# Patient Record
Sex: Female | Born: 2000 | Race: White | Hispanic: No | Marital: Single | Smoking: Former smoker
Health system: Southern US, Community
[De-identification: ages and names within clinical notes are randomized; demographics above are authoritative.]

## PROBLEM LIST (undated history)

## (undated) DIAGNOSIS — E162 Hypoglycemia, unspecified: Secondary | ICD-10-CM

## (undated) DIAGNOSIS — K589 Irritable bowel syndrome without diarrhea: Secondary | ICD-10-CM

## (undated) DIAGNOSIS — Z8709 Personal history of other diseases of the respiratory system: Secondary | ICD-10-CM

## (undated) DIAGNOSIS — F419 Anxiety disorder, unspecified: Secondary | ICD-10-CM

## (undated) HISTORY — DX: Irritable bowel syndrome, unspecified: K58.9

## (undated) HISTORY — DX: Hypoglycemia, unspecified: E16.2

## (undated) HISTORY — DX: Anxiety disorder, unspecified: F41.9

---
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
## 2018-05-02 ENCOUNTER — Encounter: Payer: Self-pay | Admitting: Obstetrics & Gynecology

## 2018-05-02 ENCOUNTER — Ambulatory Visit (INDEPENDENT_AMBULATORY_CARE_PROVIDER_SITE_OTHER): Payer: Medicaid Other | Admitting: Obstetrics & Gynecology

## 2018-05-02 VITALS — BP 102/68 | HR 70 | Ht 64.75 in | Wt 111.0 lb

## 2018-05-02 DIAGNOSIS — Z3009 Encounter for other general counseling and advice on contraception: Secondary | ICD-10-CM

## 2018-05-02 DIAGNOSIS — Z113 Encounter for screening for infections with a predominantly sexual mode of transmission: Secondary | ICD-10-CM | POA: Diagnosis not present

## 2018-05-02 DIAGNOSIS — Z3202 Encounter for pregnancy test, result negative: Secondary | ICD-10-CM | POA: Diagnosis not present

## 2018-05-02 LAB — POCT URINE PREGNANCY: Preg Test, Ur: NEGATIVE

## 2018-05-02 MED ORDER — DESOGESTREL-ETHINYL ESTRADIOL 0.15-30 MG-MCG PO TABS: 1.0000 | ORAL_TABLET | Freq: Every day | ORAL | 12 refills | Status: DC

## 2018-05-02 NOTE — Progress Notes (Signed)
Chief Complaint  Patient presents with  . Contraception      17 y.o. G0P0000 Patient's last menstrual period was 04/25/2018 (within days). The current method of family planning is none.  Outpatient Encounter Medications as of 05/02/2018  Medication Sig  . desogestrel-ethinyl estradiol (APRI,EMOQUETTE,SOLIA) 0.15-30 MG-MCG tablet Take 1 tablet by mouth daily.   No facility-administered encounter medications on file as of 05/02/2018.     Subjective Elizabeth Larsen is here for counselling for [facility] She does not want to gain weight She has been sexually active for 4 months She has never used any BCM She also wants STI testing, no symptoms and nothing specific she knows of We discussed all the different types of BCM and she decides to get OCP History reviewed. No pertinent past medical history.  History reviewed. No pertinent surgical history.  OB History    Gravida  0   Para  0   Term  0   Preterm  0   AB  0   Living  0     SAB  0   TAB  0   Ectopic  0   Multiple  0   Live Births  0           No Known Allergies  Social History   Socioeconomic History  . Marital status: Single    Spouse name: Not on file  . Number of children: Not on file  . Years of education: Not on file  . Highest education level: Not on file  Occupational History  . Not on file  Social Needs  . Financial resource strain: Not on file  . Food insecurity:    Worry: Not on file    Inability: Not on file  . Transportation needs:    Medical: Not on file    Non-medical: Not on file  Tobacco Use  . Smoking status: Never Smoker  . Smokeless tobacco: Never Used  Substance and Sexual Activity  . Alcohol use: Never    Frequency: Never  . Drug use: Never  . Sexual activity: Yes    Birth control/protection: Condom  Lifestyle  . Physical activity:    Days per week: Not on file    Minutes per session: Not on file  . Stress: Not on file  Relationships  . Social  connections:    Talks on phone: Not on file    Gets together: Not on file    Attends religious service: Not on file    Active member of club or organization: Not on file    Attends meetings of clubs or organizations: Not on file    Relationship status: Not on file  Other Topics Concern  . Not on file  Social History Narrative  . Not on file    Family History  Problem Relation Age of Onset  . Heart attack Father     Medications:       Current Outpatient Medications:  .  desogestrel-ethinyl estradiol (APRI,EMOQUETTE,SOLIA) 0.15-30 MG-MCG tablet, Take 1 tablet by mouth daily., Disp: 1 Package, Rfl: 12  Objective Blood pressure 102/68, pulse 70, height 5' 4.75" (1.645 m), weight 111 lb (50.3 kg), last menstrual period 04/25/2018.  General WDWN female NAD Vulva:  normal appearing vulva with no masses, tenderness or lesions Vagina:  normal mucosa, no discharge Cervix:  no cervical motion tenderness, no lesions and nulliparous appearance Uterus:  normal size, contour, position, consistency, mobility, non-tender Adnexa: ovaries:present,  normal adnexa in  size, nontender and no masses  Pertinent ROS No burning with urination, frequency or urgency No nausea, vomiting or diarrhea Nor fever chills or other constitutional symptoms   Labs or studies pending    Impression Diagnoses this Encounter::   ICD-10-CM   1. Encounter for counseling regarding contraception Z30.09   2. Screening examination for STD (sexually transmitted disease) Z11.3 GC/Chlamydia Probe Amp([facility])  3. Pregnancy examination or test, negative result Z32.02 POCT urine pregnancy    Established relevant diagnosis(es):   Plan/Recommendations: Meds ordered this encounter  Medications  . desogestrel-ethinyl estradiol (APRI,EMOQUETTE,SOLIA) 0.15-30 MG-MCG tablet    Sig: Take 1 tablet by mouth daily.    Dispense:  1 Package    Refill:  12    Labs or Scans Ordered: Orders Placed This Encounter    Procedures  . GC/Chlamydia Probe Amp([facility])  . POCT urine pregnancy    Management:: Begin desogestrel OCP STI testing Encouraged to use condoms to prevent STI  Follow up Return in about 1 year (around 05/03/2019) for Follow up, with Dr Despina HiddenEure.       All questions were answered.

## 2018-05-06 LAB — GC/CHLAMYDIA PROBE AMP
CHLAMYDIA, DNA PROBE: NEGATIVE
Neisseria gonorrhoeae by PCR: NEGATIVE

## 2018-05-08 ENCOUNTER — Encounter: Payer: Self-pay | Admitting: Obstetrics & Gynecology

## 2018-05-08 DIAGNOSIS — J069 Acute upper respiratory infection, unspecified: Secondary | ICD-10-CM | POA: Diagnosis not present

## 2018-05-08 DIAGNOSIS — B001 Herpesviral vesicular dermatitis: Secondary | ICD-10-CM | POA: Diagnosis not present

## 2018-05-08 DIAGNOSIS — J029 Acute pharyngitis, unspecified: Secondary | ICD-10-CM | POA: Diagnosis not present

## 2018-05-25 DIAGNOSIS — J020 Streptococcal pharyngitis: Secondary | ICD-10-CM | POA: Diagnosis not present

## 2018-06-24 DIAGNOSIS — J029 Acute pharyngitis, unspecified: Secondary | ICD-10-CM | POA: Diagnosis not present

## 2018-06-28 DIAGNOSIS — J209 Acute bronchitis, unspecified: Secondary | ICD-10-CM | POA: Diagnosis not present

## 2018-06-28 DIAGNOSIS — J029 Acute pharyngitis, unspecified: Secondary | ICD-10-CM | POA: Diagnosis not present

## 2018-08-12 DIAGNOSIS — Z23 Encounter for immunization: Secondary | ICD-10-CM | POA: Diagnosis not present

## 2018-08-15 ENCOUNTER — Telehealth: Payer: Self-pay | Admitting: Obstetrics & Gynecology

## 2018-08-15 MED ORDER — NORGESTIMATE-ETH ESTRADIOL 0.25-35 MG-MCG PO TABS: 1.0000 | ORAL_TABLET | Freq: Every day | ORAL | 11 refills | Status: DC

## 2019-01-01 ENCOUNTER — Ambulatory Visit (INDEPENDENT_AMBULATORY_CARE_PROVIDER_SITE_OTHER): Payer: Medicaid Other | Admitting: Advanced Practice Midwife

## 2019-01-01 ENCOUNTER — Encounter: Payer: Self-pay | Admitting: Advanced Practice Midwife

## 2019-01-01 VITALS — BP 111/75 | HR 94 | Ht 66.0 in | Wt 115.0 lb

## 2019-01-01 DIAGNOSIS — Z3202 Encounter for pregnancy test, result negative: Secondary | ICD-10-CM | POA: Diagnosis not present

## 2019-01-01 DIAGNOSIS — Z30016 Encounter for initial prescription of transdermal patch hormonal contraceptive device: Secondary | ICD-10-CM

## 2019-01-01 DIAGNOSIS — Z113 Encounter for screening for infections with a predominantly sexual mode of transmission: Secondary | ICD-10-CM

## 2019-01-01 LAB — POCT URINE PREGNANCY: Preg Test, Ur: NEGATIVE

## 2019-01-01 MED ORDER — NORELGESTROMIN-ETH ESTRADIOL 150-35 MCG/24HR TD PTWK: 1.0000 | MEDICATED_PATCH | TRANSDERMAL | 12 refills | Status: DC

## 2019-01-01 NOTE — Progress Notes (Signed)
[facility] Visit  Patient name: Elizabeth Larsen MRN [id number]  Date of birth: 08/05/2001  CC & HPI:  Elizabeth Larsen is a 18 y.o.  female presenting today for birth control change. Cant remember to take pills. Also has had some bleeding after sex once.    Pertinent History Reviewed:  Medical & Surgical Hx:   History reviewed. No pertinent past medical history. History reviewed. No pertinent surgical history. Family History  Problem Relation Age of Onset  . Heart attack Father     Current Outpatient Medications:  .  Norgestimate-Eth Estradiol (SPRINTEC 28 PO), Take by mouth daily., Disp: , Rfl:  .  norelgestromin-ethinyl estradiol (ORTHO EVRA) 150-35 MCG/24HR transdermal patch, Place 1 patch onto the skin once a week., Disp: 3 patch, Rfl: 12 Social History: Reviewed -  reports that she has never smoked. She has never used smokeless tobacco.  Review of Systems:   Constitutional: Negative for fever and chills Eyes: Negative for visual disturbances Respiratory: Negative for shortness of breath, dyspnea Cardiovascular: Negative for chest pain or palpitations  Gastrointestinal: Negative for vomiting, diarrhea and constipation; no abdominal pain Genitourinary: Negative for dysuria and urgency, vaginal irritation or itching Musculoskeletal: Negative for back pain, joint pain, myalgias  Neurological: Negative for dizziness and headaches    Objective Findings:    Physical Examination: Vitals:   01/01/19 1421  BP: 111/75  Pulse: 94   General appearance - well appearing, and in no distress Mental status - alert, oriented to person, place, and time Chest:  Normal respiratory effort Heart - normal rate and regular rhythm Abdomen:  Soft, nontender Pelvic: deferred Musculoskeletal:  Normal range of motion without pain Extremities:  No edema    Results for orders placed or performed in visit on 01/01/19 (from the past 24 hour(s))  POCT urine pregnancy   Collection Time: 01/01/19  2:28 PM  Result Value Ref Range   Preg Test, Ur Negative Negative      Assessment & Plan:  A:   Birth control  BTB, ? Missed pills? P:  Change to patch, check GC/CHL   Return for If you have any problems.  Jacklyn Shell CNM 01/02/2019 12:40 PM     .

## 2019-01-01 NOTE — Patient Instructions (Signed)

## 2019-01-05 LAB — GC/CHLAMYDIA PROBE AMP
Chlamydia trachomatis, NAA: NEGATIVE
NEISSERIA GONORRHOEAE BY PCR: NEGATIVE

## 2019-01-27 DIAGNOSIS — J018 Other acute sinusitis: Secondary | ICD-10-CM | POA: Diagnosis not present

## 2019-01-27 DIAGNOSIS — R6889 Other general symptoms and signs: Secondary | ICD-10-CM | POA: Diagnosis not present

## 2019-05-06 DIAGNOSIS — H10022 Other mucopurulent conjunctivitis, left eye: Secondary | ICD-10-CM | POA: Diagnosis not present

## 2019-05-08 DIAGNOSIS — J019 Acute sinusitis, unspecified: Secondary | ICD-10-CM | POA: Diagnosis not present

## 2019-05-08 DIAGNOSIS — J029 Acute pharyngitis, unspecified: Secondary | ICD-10-CM | POA: Diagnosis not present

## 2019-05-08 DIAGNOSIS — J209 Acute bronchitis, unspecified: Secondary | ICD-10-CM | POA: Diagnosis not present

## 2019-05-08 DIAGNOSIS — J069 Acute upper respiratory infection, unspecified: Secondary | ICD-10-CM | POA: Diagnosis not present

## 2019-11-19 ENCOUNTER — Encounter: Payer: Self-pay | Admitting: Obstetrics and Gynecology

## 2019-11-19 ENCOUNTER — Other Ambulatory Visit: Payer: Self-pay

## 2019-11-19 ENCOUNTER — Ambulatory Visit (INDEPENDENT_AMBULATORY_CARE_PROVIDER_SITE_OTHER): Payer: Medicaid Other | Admitting: Obstetrics and Gynecology

## 2019-11-19 VITALS — BP 123/85 | HR 65 | Ht 66.0 in | Wt 115.4 lb

## 2019-11-19 DIAGNOSIS — N6002 Solitary cyst of left breast: Secondary | ICD-10-CM

## 2019-11-19 NOTE — Progress Notes (Signed)
Patient ID: Elizabeth Larsen, female   DOB: Sep 03, 2001, 18 y.o.   MRN: [id number]    [facility] Visit  @DATE @            Patient name: Elizabeth Larsen MRN [id number]  Date of birth: [birth year]  CC & HPI:  Elizabeth Larsen is a 18 y.o. female presenting today for a lump on her left breast that she noticed 3 weeks ago. It has been increasing in size since then. She never had any symptoms before, but after noticing it, she has been experiencing some soreness.   She performs breast self examinations regularly. She currently uses the patch and has light periods with it. The patient denies redness or discharge, fever, chills or any other symptoms or complaints at this time.   ROS:  ROS  + lump on left breast - fever - chills All systems are negative except as noted in the HPI and PMH.   Pertinent History Reviewed:   Reviewed: Medical        History reviewed. No pertinent past medical history.                            Surgical Hx:   History reviewed. No pertinent surgical history. Medications: Reviewed & Updated - see associated section                       Current Outpatient Medications:  .  norelgestromin-ethinyl estradiol (ORTHO EVRA) 150-35 MCG/24HR transdermal patch, Place 1 patch onto the skin once a week., Disp: 3 patch, Rfl: 12  Social History: Reviewed -  reports that she has never smoked. She has never used smokeless tobacco.  Objective Findings:  Vitals: Blood pressure 123/85, pulse 65, height 5\' 6"  (1.676 m), weight 115 lb 6.4 oz (52.3 kg), last menstrual period 10/20/2019.  PHYSICAL EXAMINATION General appearance - alert, well appearing, and in no distress, oriented to person, place, and time and normal appearing weight Mental status - alert, oriented to person, place, and time, normal mood, behavior, speech, dress, motor activity, and thought processes, affect appropriate to mood Breast: normal breast tissue on right side. Smooth, round, mobile cyst  on left breast.  PELVIC DEFERRED  Assessment & Plan:   A:  1.  Left breast cyst  P:  1.  Scheduled Left Breast US w/ axilla for Tuesday 11/25/2019 at 4:10pm  By signing my name below, I, De Burrs, attest that this documentation has been prepared under the direction and in the presence of Jonnie Kind, MD. Electronically Signed: De Burrs, Medical Scribe. 11/19/19. 12:03 PM.  I personally performed the services described in this documentation, which was SCRIBED in my presence. The recorded information has been reviewed and considered accurate. It has been edited as necessary during review. Jonnie Kind, MD

## 2019-11-23 DIAGNOSIS — B349 Viral infection, unspecified: Secondary | ICD-10-CM | POA: Diagnosis not present

## 2019-11-23 DIAGNOSIS — R05 Cough: Secondary | ICD-10-CM | POA: Diagnosis not present

## 2019-11-23 DIAGNOSIS — R591 Generalized enlarged lymph nodes: Secondary | ICD-10-CM | POA: Diagnosis not present

## 2019-11-23 DIAGNOSIS — J029 Acute pharyngitis, unspecified: Secondary | ICD-10-CM | POA: Diagnosis not present

## 2019-11-25 ENCOUNTER — Other Ambulatory Visit: Payer: Self-pay

## 2019-11-25 ENCOUNTER — Other Ambulatory Visit (HOSPITAL_COMMUNITY): Payer: Medicaid Other

## 2019-11-25 ENCOUNTER — Ambulatory Visit (HOSPITAL_COMMUNITY)
Admission: RE | Admit: 2019-11-25 | Discharge: 2019-11-25 | Disposition: A | Discharge status: Home / Self Care | Payer: Medicaid Other | Source: Ambulatory Visit | Attending: Obstetrics and Gynecology | Admitting: Obstetrics and Gynecology

## 2019-11-25 DIAGNOSIS — N6325 Unspecified lump in the left breast, overlapping quadrants: Secondary | ICD-10-CM | POA: Diagnosis not present

## 2019-11-25 DIAGNOSIS — N6002 Solitary cyst of left breast: Secondary | ICD-10-CM | POA: Insufficient documentation

## 2020-01-01 DIAGNOSIS — J029 Acute pharyngitis, unspecified: Secondary | ICD-10-CM | POA: Diagnosis not present

## 2020-01-14 ENCOUNTER — Other Ambulatory Visit: Payer: Self-pay

## 2020-01-14 ENCOUNTER — Encounter: Payer: Self-pay | Admitting: Obstetrics and Gynecology

## 2020-01-14 ENCOUNTER — Ambulatory Visit (INDEPENDENT_AMBULATORY_CARE_PROVIDER_SITE_OTHER): Payer: Medicaid Other | Admitting: Obstetrics and Gynecology

## 2020-01-14 VITALS — BP 113/79 | HR 65 | Ht 66.0 in | Wt 115.0 lb

## 2020-01-14 DIAGNOSIS — Z3009 Encounter for other general counseling and advice on contraception: Secondary | ICD-10-CM | POA: Diagnosis not present

## 2020-01-14 DIAGNOSIS — Z3202 Encounter for pregnancy test, result negative: Secondary | ICD-10-CM | POA: Diagnosis not present

## 2020-01-14 LAB — POCT URINE PREGNANCY: Preg Test, Ur: NEGATIVE

## 2020-01-14 MED ORDER — NORETHIN ACE-ETH ESTRAD-FE 1-20 MG-MCG(24) PO TABS: 1.0000 | ORAL_TABLET | Freq: Every day | ORAL | 3 refills | Status: DC

## 2020-01-14 NOTE — Progress Notes (Signed)
Patient ID: ALMAS RAKE, female   DOB: [birth year], 19 y.o.   MRN: [id number]    [facility] Clinic Visit  @DATE @            Patient name: Elizabeth Larsen MRN Elizabeth Larsen  Date of birth: June 30, 2001  CC & HPI:  Elizabeth Larsen is a 19 y.o. female presenting today to discuss birth control options. She currently has the patch but is more interested in switching to the pill.  Of note, since her last visit on 11/19/2019 regarding a breast cyst, she had a left breast ultrasound on 11/25/2019. 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  ROS:  ROS    All systems are negative except as noted in the HPI and PMH.   Pertinent History Reviewed:   Reviewed Medical        History reviewed. No pertinent past medical history.                            Surgical Hx:   History reviewed. No pertinent surgical history. Medications: Reviewed & Updated - see associated section                       Current Outpatient Medications:  .  amoxicillin (AMOXIL) 875 MG tablet, Take by mouth., Disp: , Rfl:  .  fluconazole (DIFLUCAN) 150 MG tablet, Take by mouth., Disp: , Rfl:  .  norelgestromin-ethinyl estradiol (ORTHO EVRA) 150-35 MCG/24HR transdermal patch, Place 1 patch onto the skin once a week., Disp: 3 patch, Rfl: 12   Social History: Reviewed -  reports that she has been smoking e-cigarettes. She has never used smokeless tobacco.  Objective Findings:  Vitals: Blood pressure 113/79, pulse 65, height 5\' 6"  (1.676 m), weight 115 lb (52.2 kg), last menstrual period 12/17/2019.  PHYSICAL EXAMINATION General appearance - alert, well appearing, and in no distress and oriented to person, place, and time Mental status - normal mood, behavior, speech, dress, motor activity, and thought processes, affect appropriate to mood Chest - not examined Heart - normal rate and regular rhythm Abdomen -  Breasts -  Skin -   PELVIC    Assessment & Plan:   A:  1.  Birth  control  P:  1.  switch to loloestrin 1/20 3 pack    By signing my name below, I, 12/19/2019, attest that this documentation has been prepared under the direction and in the presence of 2/20, MD. Electronically Signed: Maleeha Nikki Larsen. 01/14/20. 8:53 AM.  I personally performed the services described in this documentation, which was SCRIBED in my presence. The recorded information has been reviewed and considered accurate. It has been edited as necessary during review. [employer], MD

## 2020-01-16 DIAGNOSIS — J3503 Chronic tonsillitis and adenoiditis: Secondary | ICD-10-CM | POA: Diagnosis not present

## 2020-01-16 DIAGNOSIS — J039 Acute tonsillitis, unspecified: Secondary | ICD-10-CM | POA: Diagnosis not present

## 2020-01-23 ENCOUNTER — Other Ambulatory Visit: Payer: Self-pay | Admitting: Otolaryngology

## 2020-01-23 DIAGNOSIS — J3503 Chronic tonsillitis and adenoiditis: Secondary | ICD-10-CM | POA: Diagnosis not present

## 2020-01-23 DIAGNOSIS — J353 Hypertrophy of tonsils with hypertrophy of adenoids: Secondary | ICD-10-CM | POA: Diagnosis not present

## 2020-01-26 ENCOUNTER — Encounter (HOSPITAL_BASED_OUTPATIENT_CLINIC_OR_DEPARTMENT_OTHER): Payer: Self-pay | Admitting: Otolaryngology

## 2020-01-26 ENCOUNTER — Other Ambulatory Visit: Payer: Self-pay

## 2020-01-29 ENCOUNTER — Other Ambulatory Visit (HOSPITAL_COMMUNITY)
Admission: RE | Admit: 2020-01-29 | Discharge: 2020-01-29 | Disposition: A | Discharge status: Home / Self Care | Payer: Medicaid Other | Source: Ambulatory Visit | Attending: Otolaryngology | Admitting: Otolaryngology

## 2020-01-29 ENCOUNTER — Other Ambulatory Visit: Payer: Self-pay

## 2020-01-29 DIAGNOSIS — Z20822 Contact with and (suspected) exposure to covid-19: Secondary | ICD-10-CM | POA: Insufficient documentation

## 2020-01-30 LAB — SARS CORONAVIRUS 2 (TAT 6-24 HRS): SARS Coronavirus 2: NEGATIVE

## 2020-02-02 ENCOUNTER — Ambulatory Visit (HOSPITAL_BASED_OUTPATIENT_CLINIC_OR_DEPARTMENT_OTHER)
Admission: RE | Admit: 2020-02-02 | Discharge: 2020-02-02 | Disposition: A | Discharge status: Home / Self Care | Payer: Medicaid Other | Attending: Otolaryngology | Admitting: Otolaryngology

## 2020-02-02 ENCOUNTER — Encounter (HOSPITAL_BASED_OUTPATIENT_CLINIC_OR_DEPARTMENT_OTHER): Payer: Self-pay | Admitting: Otolaryngology

## 2020-02-02 ENCOUNTER — Ambulatory Visit (HOSPITAL_BASED_OUTPATIENT_CLINIC_OR_DEPARTMENT_OTHER): Payer: Medicaid Other | Admitting: Certified Registered Nurse Anesthetist

## 2020-02-02 ENCOUNTER — Encounter (HOSPITAL_BASED_OUTPATIENT_CLINIC_OR_DEPARTMENT_OTHER)
Admission: RE | Disposition: A | Discharge status: Home / Self Care | Payer: Self-pay | Source: Home / Self Care | Attending: Otolaryngology

## 2020-02-02 ENCOUNTER — Other Ambulatory Visit: Payer: Self-pay

## 2020-02-02 DIAGNOSIS — R1310 Dysphagia, unspecified: Secondary | ICD-10-CM | POA: Insufficient documentation

## 2020-02-02 DIAGNOSIS — J353 Hypertrophy of tonsils with hypertrophy of adenoids: Secondary | ICD-10-CM | POA: Diagnosis not present

## 2020-02-02 DIAGNOSIS — J3501 Chronic tonsillitis: Secondary | ICD-10-CM | POA: Insufficient documentation

## 2020-02-02 DIAGNOSIS — J358 Other chronic diseases of tonsils and adenoids: Secondary | ICD-10-CM | POA: Insufficient documentation

## 2020-02-02 DIAGNOSIS — J3503 Chronic tonsillitis and adenoiditis: Secondary | ICD-10-CM | POA: Diagnosis not present

## 2020-02-02 DIAGNOSIS — F17200 Nicotine dependence, unspecified, uncomplicated: Secondary | ICD-10-CM | POA: Diagnosis not present

## 2020-02-02 HISTORY — PX: TONSILLECTOMY AND ADENOIDECTOMY: SHX28

## 2020-02-02 HISTORY — DX: Personal history of other diseases of the respiratory system: Z87.09

## 2020-02-02 LAB — POCT PREGNANCY, URINE: Preg Test, Ur: NEGATIVE

## 2020-02-02 SURGERY — TONSILLECTOMY AND ADENOIDECTOMY
Anesthesia: General | Site: Mouth

## 2020-02-02 MED ORDER — FENTANYL CITRATE (PF) 100 MCG/2ML IJ SOLN
INTRAMUSCULAR | Status: AC
  Filled 2020-02-02: qty 2

## 2020-02-02 MED ORDER — FENTANYL CITRATE (PF) 100 MCG/2ML IJ SOLN
25.0000 ug | INTRAMUSCULAR | Status: DC | PRN
  Administered 2020-02-02: 10:00:00 25 ug via INTRAVENOUS

## 2020-02-02 MED ORDER — ACETAMINOPHEN 10 MG/ML IV SOLN
INTRAVENOUS | Status: AC
  Filled 2020-02-02: qty 100

## 2020-02-02 MED ORDER — ACETAMINOPHEN 160 MG/5ML PO SOLN: 325.0000 mg | Freq: Once | ORAL | Status: DC | PRN

## 2020-02-02 MED ORDER — MIDAZOLAM HCL 2 MG/2ML IJ SOLN
INTRAMUSCULAR | Status: DC | PRN
  Administered 2020-02-02: 2 mg via INTRAVENOUS

## 2020-02-02 MED ORDER — PROMETHAZINE HCL 25 MG/ML IJ SOLN: 6.2500 mg | INTRAMUSCULAR | Status: DC | PRN

## 2020-02-02 MED ORDER — DEXAMETHASONE SODIUM PHOSPHATE 10 MG/ML IJ SOLN
INTRAMUSCULAR | Status: AC
  Filled 2020-02-02: qty 1

## 2020-02-02 MED ORDER — MIDAZOLAM HCL 2 MG/2ML IJ SOLN
INTRAMUSCULAR | Status: AC
  Filled 2020-02-02: qty 2

## 2020-02-02 MED ORDER — LACTATED RINGERS IV SOLN: INTRAVENOUS | Status: DC

## 2020-02-02 MED ORDER — OXYCODONE HCL 5 MG PO TABS: 5.0000 mg | ORAL_TABLET | Freq: Once | ORAL | Status: DC | PRN

## 2020-02-02 MED ORDER — ACETAMINOPHEN 325 MG PO TABS: 325.0000 mg | ORAL_TABLET | Freq: Once | ORAL | Status: DC | PRN

## 2020-02-02 MED ORDER — OXYMETAZOLINE HCL 0.05 % NA SOLN
NASAL | Status: DC | PRN
  Administered 2020-02-02: 1 via TOPICAL

## 2020-02-02 MED ORDER — OXYCODONE-ACETAMINOPHEN 5-325 MG PO TABS: 1.0000 | ORAL_TABLET | ORAL | 0 refills | Status: AC | PRN

## 2020-02-02 MED ORDER — DEXAMETHASONE SODIUM PHOSPHATE 10 MG/ML IJ SOLN
INTRAMUSCULAR | Status: DC | PRN
  Administered 2020-02-02: 10 mg via INTRAVENOUS

## 2020-02-02 MED ORDER — OXYCODONE HCL 5 MG/5ML PO SOLN: 5.0000 mg | Freq: Once | ORAL | Status: DC | PRN

## 2020-02-02 MED ORDER — PROPOFOL 10 MG/ML IV BOLUS
INTRAVENOUS | Status: DC | PRN
  Administered 2020-02-02: 130 mg via INTRAVENOUS

## 2020-02-02 MED ORDER — LIDOCAINE 2% (20 MG/ML) 5 ML SYRINGE
INTRAMUSCULAR | Status: AC
  Filled 2020-02-02: qty 5

## 2020-02-02 MED ORDER — SUCCINYLCHOLINE CHLORIDE 200 MG/10ML IV SOSY
PREFILLED_SYRINGE | INTRAVENOUS | Status: DC | PRN
  Administered 2020-02-02: 50 mg via INTRAVENOUS

## 2020-02-02 MED ORDER — SUCCINYLCHOLINE CHLORIDE 200 MG/10ML IV SOSY
PREFILLED_SYRINGE | INTRAVENOUS | Status: AC
  Filled 2020-02-02: qty 10

## 2020-02-02 MED ORDER — LIDOCAINE 2% (20 MG/ML) 5 ML SYRINGE
INTRAMUSCULAR | Status: DC | PRN
  Administered 2020-02-02: 60 mg via INTRAVENOUS

## 2020-02-02 MED ORDER — AMOXICILLIN 400 MG/5ML PO SUSR: 800.0000 mg | Freq: Two times a day (BID) | ORAL | 0 refills | Status: AC

## 2020-02-02 MED ORDER — ACETAMINOPHEN 10 MG/ML IV SOLN
1000.0000 mg | Freq: Once | INTRAVENOUS | Status: DC | PRN
  Administered 2020-02-02: 1000 mg via INTRAVENOUS

## 2020-02-02 MED ORDER — FENTANYL CITRATE (PF) 100 MCG/2ML IJ SOLN
INTRAMUSCULAR | Status: DC | PRN
  Administered 2020-02-02: 100 ug via INTRAVENOUS

## 2020-02-02 MED ORDER — ONDANSETRON HCL 4 MG/2ML IJ SOLN
INTRAMUSCULAR | Status: DC | PRN
  Administered 2020-02-02: 4 mg via INTRAVENOUS

## 2020-02-02 MED ORDER — MEPERIDINE HCL 25 MG/ML IJ SOLN: 6.2500 mg | INTRAMUSCULAR | Status: DC | PRN

## 2020-02-02 MED ORDER — ONDANSETRON HCL 4 MG/2ML IJ SOLN
INTRAMUSCULAR | Status: AC
  Filled 2020-02-02: qty 2

## 2020-02-02 SURGICAL SUPPLY — 33 items
BNDG COHESIVE 2X5 TAN STRL LF (GAUZE/BANDAGES/DRESSINGS) IMPLANT
CANISTER SUCT 1200ML W/VALVE (MISCELLANEOUS) ×3 IMPLANT
CATH ROBINSON RED A/P 10FR (CATHETERS) ×3 IMPLANT
CATH ROBINSON RED A/P 14FR (CATHETERS) IMPLANT
COAGULATOR SUCT 6 FR SWTCH (ELECTROSURGICAL) ×1
COAGULATOR SUCT SWTCH 10FR 6 (ELECTROSURGICAL) ×2 IMPLANT
COVER BACK TABLE 60X90IN (DRAPES) ×3 IMPLANT
COVER MAYO STAND STRL (DRAPES) ×3 IMPLANT
COVER WAND RF STERILE (DRAPES) IMPLANT
ELECT REM PT RETURN 9FT ADLT (ELECTROSURGICAL) ×3
ELECT REM PT RETURN 9FT PED (ELECTROSURGICAL)
ELECTRODE REM PT RETRN 9FT PED (ELECTROSURGICAL) IMPLANT
ELECTRODE REM PT RTRN 9FT ADLT (ELECTROSURGICAL) ×1 IMPLANT
GAUZE SPONGE 4X4 12PLY STRL LF (GAUZE/BANDAGES/DRESSINGS) ×3 IMPLANT
GLOVE BIO SURGEON STRL SZ 6.5 (GLOVE) ×2 IMPLANT
GLOVE BIO SURGEON STRL SZ7.5 (GLOVE) ×3 IMPLANT
GLOVE BIO SURGEONS STRL SZ 6.5 (GLOVE) ×1
GOWN STRL REUS W/ TWL LRG LVL3 (GOWN DISPOSABLE) ×2 IMPLANT
GOWN STRL REUS W/TWL LRG LVL3 (GOWN DISPOSABLE) ×6
IV NS 500ML (IV SOLUTION) ×3
IV NS 500ML BAXH (IV SOLUTION) ×1 IMPLANT
MARKER SKIN DUAL TIP RULER LAB (MISCELLANEOUS) IMPLANT
NS IRRIG 1000ML POUR BTL (IV SOLUTION) ×3 IMPLANT
SHEET MEDIUM DRAPE 40X70 STRL (DRAPES) ×3 IMPLANT
SOLUTION BUTLER CLEAR DIP (MISCELLANEOUS) ×3 IMPLANT
SPONGE TONSIL TAPE 1.25 RFD (DISPOSABLE) ×3 IMPLANT
SYR BULB 3OZ (MISCELLANEOUS) IMPLANT
TOWEL GREEN STERILE FF (TOWEL DISPOSABLE) ×3 IMPLANT
TUBE CONNECTING 20'X1/4 (TUBING) ×1
TUBE CONNECTING 20X1/4 (TUBING) ×2 IMPLANT
TUBE SALEM SUMP 12R W/ARV (TUBING) IMPLANT
TUBE SALEM SUMP 16 FR W/ARV (TUBING) ×3 IMPLANT
WAND COBLATOR 70 EVAC XTRA (SURGICAL WAND) ×3 IMPLANT

## 2020-02-02 NOTE — Discharge Instructions (Signed)
[facility] Postoperative Instructions for Tonsillectomy & Adenoidectomy (T&A) Activity Restrict activity at home for the first two days, resting as much as possible. Light indoor activity is best. You may usually return to school or work within a week but void strenuous activity and sports for two weeks. Sleep with your head elevated on 2-3 pillows for 3-4 days to help decrease swelling. Diet Due to tissue swelling and throat discomfort, you may have little desire to drink for several days. However fluids are very important to prevent dehydration. You will find that non-acidic juices, soups, popsicles, Jell-O, custard, puddings, and any soft or mashed foods taken in small quantities can be swallowed fairly easily. Try to increase your fluid and food intake as the discomfort subsides. It is recommended that a child receive 1-1/2 quarts of fluid in a 24-hour period. Adult require twice this amount.  Discomfort Your sore throat may be relieved by applying an ice collar to your neck and/or by taking Tylenol. You may experience an earache, which is due to referred pain from the throat. Referred ear pain is commonly felt at night when trying to rest.  Bleeding                        Although rare, there is risk of having some bleeding during the first 2 weeks after having a T&A. This usually happens between days 7-10 postoperatively. If you or your child should have any bleeding, try to remain calm. We recommend sitting up quietly in a chair and gently spitting out the blood into a bowl. For adults, gargling gently with ice water may help. If the bleeding does not stop after a short time (5 minutes), is more than 1 teaspoonful, or if you become worried, please call our office at [phone] or go directly to the nearest hospital emergency room. Do not eat or drink anything prior to going to the hospital as you may need to be taken to the operating room in order to control the bleeding. GENERAL  CONSIDERATIONS 1. Brush your teeth regularly. Avoid mouthwashes and gargles for three weeks. You may gargle gently with warm salt-water as necessary or spray with Chloraseptic. You may make salt-water by placing 2 teaspoons of table salt into a quart of fresh water. Warm the salt-water in a microwave to a luke warm temperature.  2. Avoid exposure to colds and upper respiratory infections if possible.  3. If you look into a mirror or into your child's mouth, you will see white-gray patches in the back of the throat. This is normal after having a T&A and is like a scab that forms on the skin after an abrasion. It will disappear once the back of the throat heals completely. However, it may cause a noticeable odor; this too will disappear with time. Again, warm salt-water gargles may be used to help keep the throat clean and promote healing.  4. You may notice a temporary change in voice quality, such as a higher pitched voice or a nasal sound, until healing is complete. This may last for 1-2 weeks and should resolve.  5. Do not take or give you child any medications that we have not prescribed or recommended.  6. Snoring may occur, especially at night, for the first week after a T&A. It is due to swelling of the soft palate and will usually resolve.  Please call our office at [phone] if you have any questions.       Post Anesthesia Home Care Instructions  Activity: Get plenty of rest for the remainder of the day. A responsible individual must stay with you for 24 hours following the procedure.  For the next 24 hours, DO NOT: -Drive a car -Advertising copywriter -Drink alcoholic beverages -Take any medication unless instructed by your physician -Make any legal decisions or sign important papers.  Meals: Start with liquid foods such as gelatin or soup. Progress to regular foods as tolerated. Avoid greasy, spicy, heavy foods. If nausea and/or vomiting occur, drink only clear liquids until the nausea  and/or vomiting subsides. Call your physician if vomiting continues.  Special Instructions/Symptoms: Your throat may feel dry or sore from the anesthesia or the breathing tube placed in your throat during surgery. If this causes discomfort, gargle with warm salt water. The discomfort should disappear within 24 hours.  Call your surgeon if you experience:   1.  Fever over 101.0. 2.  Inability to urinate. 3.  Nausea and/or vomiting. 4.  Extreme swelling or bruising at the surgical site. 5.  Continued bleeding from the incision. 6.  Increased pain, redness or drainage from the incision. 7.  Problems related to your pain medication. 8.  Any problems and/or concerns  May have Tylenol after 3:30pm

## 2020-02-02 NOTE — Anesthesia Postprocedure Evaluation (Signed)
Anesthesia Post Note  Patient: Elizabeth Larsen  Procedure(s) Performed: TONSILLECTOMY AND ADENOIDECTOMY (N/A Mouth)     Patient location during evaluation: PACU Anesthesia Type: General Level of consciousness: awake and alert Pain management: pain level controlled Vital Signs Assessment: post-procedure vital signs reviewed and stable Respiratory status: spontaneous breathing, nonlabored ventilation, respiratory function stable and patient connected to nasal cannula oxygen Cardiovascular status: blood pressure returned to baseline and stable Postop Assessment: no apparent nausea or vomiting Anesthetic complications: no    Last Vitals:  Vitals:   02/02/20 1000 02/02/20 1019  BP:  (!) 140/93  Pulse: 61 72  Resp: (!) 23 18  Temp:  36.8 C  SpO2: 100% 100%    Last Pain:  Vitals:   02/02/20 1019  TempSrc: Oral  PainSc: 6                  Shelton Silvas

## 2020-02-02 NOTE — Anesthesia Preprocedure Evaluation (Addendum)
Anesthesia Evaluation  Patient identified by MRN, date of birth, ID band Patient awake    Reviewed: Allergy & Precautions, NPO status , Patient's Chart, lab work & pertinent test results  Airway Mallampati: I  TM Distance: >3 FB Neck ROM: Full    Dental  (+) Teeth Intact, Dental Advisory Given   Pulmonary Current Smoker and Patient abstained from smoking.,    breath sounds clear to auscultation       Cardiovascular negative cardio ROS   Rhythm:Regular Rate:Normal     Neuro/Psych negative neurological ROS  negative psych ROS   GI/Hepatic negative GI ROS, Neg liver ROS,   Endo/Other  negative endocrine ROS  Renal/GU negative Renal ROS     Musculoskeletal negative musculoskeletal ROS (+)   Abdominal Normal abdominal exam  (+)   Peds  Hematology negative hematology ROS (+)   Anesthesia Other Findings   Reproductive/Obstetrics                            Anesthesia Physical Anesthesia Plan  ASA: II  Anesthesia Plan: General   Post-op Pain Management:    Induction: Intravenous  PONV Risk Score and Plan: 3 and Ondansetron, Dexamethasone and Midazolam  Airway Management Planned: Oral ETT  Additional Equipment: None  Intra-op Plan:   Post-operative Plan: Extubation in OR  Informed Consent: I have reviewed the patients History and Physical, chart, labs and discussed the procedure including the risks, benefits and alternatives for the proposed anesthesia with the patient or authorized representative who has indicated his/her understanding and acceptance.     Dental advisory given  Plan Discussed with: CRNA  Anesthesia Plan Comments:        Anesthesia Quick Evaluation

## 2020-02-02 NOTE — H&P (Signed)
Cc: Recurrent tonsillitis and sore throat  HPI: The patient is a 19 year old female who presents today with her mother.  The patient complains of recurrent tonsillitis and sore throat for the past year.  She has had more than 10 episodes of tonsillitis over the past 12 months.  She was treated with multiple courses of antibiotics.  As a result, she has been experiencing frequent dysphagia and odynophagia.  She has also noted frequent tonsil stone formations.  She has no previous history of ENT surgery.  She has been vaping for the past year.   The patient's review of systems (constitutional, eyes, ENT, cardiovascular, respiratory, GI, musculoskeletal, skin, neurologic, psychiatric, endocrine, hematologic, allergic) is noted in the ROS questionnaire.  It is reviewed with the patient.  Family health history: No HTN, DM, CAD, hearing loss or bleeding disorder.  Major events: None.  Ongoing medical problems: None.  Social history: The patient is single. She vapes daily. She denies the use of alcohol or illegal drugs.    Exam: General: Communicates without difficulty, well nourished, no acute distress. Head: Normocephalic, no evidence injury, no tenderness, facial buttresses intact without stepoff. Face/sinus: No tenderness to palpation and percussion. Facial movement is normal and symmetric. Eyes: PERRL, EOMI. No scleral icterus, conjunctivae clear. Neuro: CN II exam reveals vision grossly intact.  No nystagmus at any point of gaze. Ears: Auricles well formed without lesions.  Ear canals are intact without mass or lesion.  No erythema or edema is appreciated.  The TMs are intact without fluid. Nose: External evaluation reveals normal support and skin without lesions.  Dorsum is intact.  Anterior rhinoscopy reveals pink mucosa over anterior aspect of inferior turbinates and intact septum.  No purulence noted. Oral:  Oral cavity and oropharynx are intact, symmetric, without erythema or edema.  Mucosa is moist  without lesions. Tonsils are 3+ and mildly erythematous. Neck: Full range of motion without pain.  There is no significant lymphadenopathy.  No masses palpable.  Thyroid bed within normal limits to palpation.  Parotid glands and submandibular glands equal bilaterally without mass.  Trachea is midline. Neuro:  CN 2-12 grossly intact. Gait normal.   Assessment 1.  The patient's history and physical exam findings are consistent with chronic tonsillitis and pharyngitis, secondary to adenotonsillar hypertrophy.   2.  The rest of her ENT exam is normal.    Plan  1.  The physical exam findings are reviewed with the patient and her mother.  2.  Based on the above findings, the patient may benefit from undergoing the adenotonsillectomy procedure.  The risks, benefits, alternatives and details of the procedure are reviewed.  Questions are invited and answered.  3.  The patient would like to proceed with the procedure.

## 2020-02-02 NOTE — Op Note (Signed)
DATE OF PROCEDURE:  02/02/2020                              OPERATIVE REPORT  SURGEON:  Newman Pies, MD  PREOPERATIVE DIAGNOSES: 1. Adenotonsillar hypertrophy. 2. Chronic tonsillitis and pharyngitis  POSTOPERATIVE DIAGNOSES: 1. Adenotonsillar hypertrophy. 2. Chronic tonsillitis and pharyngitis  PROCEDURE PERFORMED:  Adenotonsillectomy.  ANESTHESIA:  General endotracheal tube anesthesia.  COMPLICATIONS:  None.  ESTIMATED BLOOD LOSS:  Minimal.  INDICATION FOR PROCEDURE:  Elizabeth Larsen is a 19 y.o. female with a history of chronic tonsillitis/pharyngitis and halitosis.  According to the patient, she has been experiencing chronic throat discomfort with halitosis for several years. The patient continued to be symptomatic despite medical treatments. On examination, the patient was noted to have bilateral cryptic tonsils, with numerous tonsilloliths. Based on the above findings, the decision was made for the patient to undergo the adenotonsillectomy procedure. Likelihood of success in reducing symptoms was also discussed.  The risks, benefits, alternatives, and details of the procedure were discussed with the patient.  Questions were invited and answered.  Informed consent was obtained.  DESCRIPTION:  The patient was taken to the operating room and placed supine on the operating table.  General endotracheal tube anesthesia was administered by the anesthesiologist.  The patient was positioned and prepped and draped in a standard fashion for adenotonsillectomy.  A Crowe-Davis mouth gag was inserted into the oral cavity for exposure. 2+ cryptic tonsils were noted bilaterally.  No bifidity was noted.  Indirect mirror examination of the nasopharynx revealed mild adenoid hypertrophy. The adenoid was ablated with the Coblator device. Hemostasis was achieved with the Coblator device.  The right tonsil was then grasped with a straight Allis clamp and retracted medially.  It was resected free from the  underlying pharyngeal constrictor muscles with the Coblator device.  The same procedure was repeated on the left side without exception.  The surgical sites were copiously irrigated.  The mouth gag was removed.  The care of the patient was turned over to the anesthesiologist.  The patient was awakened from anesthesia without difficulty.  The patient was extubated and transferred to the recovery room in good condition.  OPERATIVE FINDINGS:  Adenotonsillar hypertrophy.  SPECIMEN:  None.  FOLLOWUP CARE:  The patient will be discharged home once awake and alert.  She will be placed on amoxicillin 800 mg p.o. b.i.d. for 5 days, and percocet for postop pain control.   The patient will follow up in my office in approximately 2 weeks.  Donley Harland W Tammela Bales 02/02/2020 9:13 AM

## 2020-02-02 NOTE — Anesthesia Procedure Notes (Signed)
Procedure Name: Intubation Date/Time: 02/02/2020 8:44 AM Performed by: Pearson Grippe, CRNA Pre-anesthesia Checklist: Patient identified, Emergency Drugs available, Suction available and Patient being monitored Patient Re-evaluated:Patient Re-evaluated prior to induction Oxygen Delivery Method: Circle system utilized Preoxygenation: Pre-oxygenation with 100% oxygen Induction Type: IV induction Ventilation: Mask ventilation without difficulty Laryngoscope Size: Miller and 2 Grade View: Grade I Tube type: Oral Tube size: 7.0 mm Number of attempts: 1 Airway Equipment and Method: Stylet and Oral airway Placement Confirmation: ETT inserted through vocal cords under direct vision,  positive ETCO2 and breath sounds checked- equal and bilateral Secured at: 21 cm Tube secured with: Tape Dental Injury: Teeth and Oropharynx as per pre-operative assessment

## 2020-02-02 NOTE — Transfer of Care (Signed)
Immediate Anesthesia Transfer of Care Note  Patient: Elizabeth Larsen  Procedure(s) Performed: TONSILLECTOMY AND ADENOIDECTOMY (N/A Mouth)  Patient Location: PACU  Anesthesia Type:General  Level of Consciousness: awake, alert  and oriented  Airway & Oxygen Therapy: Patient Spontanous Breathing and Patient connected to face mask oxygen  Post-op Assessment: Report given to RN and Post -op Vital signs reviewed and stable  Post vital signs: Reviewed and stable  Last Vitals:  Vitals Value Taken Time  BP 131/96 02/02/20 0922  Temp    Pulse 102 02/02/20 0925  Resp 18 02/02/20 0925  SpO2 100 % 02/02/20 0925  Vitals shown include unvalidated device data.  Last Pain:  Vitals:   02/02/20 0748  TempSrc: Tympanic  PainSc: 0-No pain      Patients Stated Pain Goal: 7 (02/02/20 0748)  Complications: No apparent anesthesia complications

## 2020-02-03 ENCOUNTER — Encounter: Payer: Self-pay | Admitting: *Deleted

## 2020-04-01 DIAGNOSIS — R519 Headache, unspecified: Secondary | ICD-10-CM | POA: Diagnosis not present

## 2020-04-01 DIAGNOSIS — R52 Pain, unspecified: Secondary | ICD-10-CM | POA: Diagnosis not present

## 2020-04-01 DIAGNOSIS — R6883 Chills (without fever): Secondary | ICD-10-CM | POA: Diagnosis not present

## 2020-05-08 DIAGNOSIS — R05 Cough: Secondary | ICD-10-CM | POA: Diagnosis not present

## 2020-05-08 DIAGNOSIS — R0981 Nasal congestion: Secondary | ICD-10-CM | POA: Diagnosis not present

## 2020-05-28 ENCOUNTER — Other Ambulatory Visit: Payer: Self-pay

## 2020-05-28 ENCOUNTER — Encounter: Payer: Self-pay | Admitting: Student

## 2020-05-28 ENCOUNTER — Ambulatory Visit (INDEPENDENT_AMBULATORY_CARE_PROVIDER_SITE_OTHER): Payer: Medicaid Other | Admitting: Student

## 2020-05-28 VITALS — BP 94/63 | HR 76 | Temp 98.6°F | Ht 66.0 in | Wt 110.6 lb

## 2020-05-28 DIAGNOSIS — R258 Other abnormal involuntary movements: Secondary | ICD-10-CM

## 2020-05-28 DIAGNOSIS — R251 Tremor, unspecified: Secondary | ICD-10-CM | POA: Insufficient documentation

## 2020-05-28 DIAGNOSIS — N911 Secondary amenorrhea: Secondary | ICD-10-CM | POA: Diagnosis not present

## 2020-05-28 DIAGNOSIS — J3501 Chronic tonsillitis: Secondary | ICD-10-CM | POA: Insufficient documentation

## 2020-05-28 DIAGNOSIS — N912 Amenorrhea, unspecified: Secondary | ICD-10-CM | POA: Insufficient documentation

## 2020-05-28 DIAGNOSIS — Z793 Long term (current) use of hormonal contraceptives: Secondary | ICD-10-CM | POA: Diagnosis not present

## 2020-05-28 NOTE — Assessment & Plan Note (Signed)
A: Patient started taking LOESTRIN 24 FE two years ago and no longer gets periods.  She notes that her menstrual cycle was delayed, beginning at age 19.  Her menstrual cycles were previously regular, occurring within 28-32 days.  P: Continue birth control.  - Will check TSH, prolactin, FSH and LH levels given delayed puberty with other symptoms concerning for DM vs. Hypoglycemia today.

## 2020-05-28 NOTE — Patient Instructions (Signed)
Today, we discussed that your shaky spells that you have been having could possibly be due to post-prandial hypoglycemia - when you blood sugar drops after a meal.  We will check labs including HbA1c, blood glucose, and hormones to rule out diabetes, thyroid, or other disorders.   I will call you with results early next week.  We can wait to schedule your next visit for Monday or Tuesday the 19th or 20th of this month when I call with your results to find a time that works best for you.  Please try to take your glucose measurements with the Contour device whenever you feel symptoms and keep a log of the time these symptoms happen and how long after eating each episode occurs.  Please don't hesitate to call with questions at [phone].  Thank you,  Dr. Jeralyn Bennett   Hypoglycemia Hypoglycemia is when the sugar (glucose) level in your blood is too low. Signs of low blood sugar may include:  Feeling: ? Hungry. ? Worried or nervous (anxious). ? Sweaty and clammy. ? Confused. ? Dizzy. ? Sleepy. ? Sick to your stomach (nauseous).  Having: ? A fast heartbeat. ? A headache. ? A change in your vision. ? Tingling or no feeling (numbness) around your mouth, lips, or tongue. ? Jerky movements that you cannot control (seizure).  Having trouble with: ? Moving (coordination). ? Sleeping. ? Passing out (fainting). ? Getting upset easily (irritability). Low blood sugar can happen to people who have diabetes and people who do not have diabetes. Low blood sugar can happen quickly, and it can be an emergency. Treating low blood sugar Low blood sugar is often treated by eating or drinking something sugary right away, such as:  Fruit juice, 4-6 oz (120-150 mL).  Regular soda (not diet soda), 4-6 oz (120-150 mL).  Low-fat milk, 4 oz (120 mL).  Several pieces of hard candy.  Sugar or honey, 1 Tbsp (15 mL). Treating low blood sugar if you have diabetes If you can think clearly and  swallow safely, follow the 15:15 rule:  Take 15 grams of a fast-acting carb (carbohydrate). Talk with your doctor about how much you should take.  Always keep a source of fast-acting carb with you, such as: ? Sugar tablets (glucose pills). Take 3-4 pills. ? 6-8 pieces of hard candy. ? 4-6 oz (120-150 mL) of fruit juice. ? 4-6 oz (120-150 mL) of regular (not diet) soda. ? 1 Tbsp (15 mL) honey or sugar.  Check your blood sugar 15 minutes after you take the carb.  If your blood sugar is still at or below 70 mg/dL (3.9 mmol/L), take 15 grams of a carb again.  If your blood sugar does not go above 70 mg/dL (3.9 mmol/L) after 3 tries, get help right away.  After your blood sugar goes back to normal, eat a meal or a snack within 1 hour.  Treating very low blood sugar If your blood sugar is at or below 54 mg/dL (3 mmol/L), you have very low blood sugar (severe hypoglycemia). This may also cause:  Passing out.  Jerky movements you cannot control (seizure).  Losing consciousness (coma). This is an emergency. Do not wait to see if the symptoms will go away. Get medical help right away. Call your local emergency services (911 in the [place] Do not drive yourself to the hospital. If you have very low blood sugar and you cannot eat or drink, you may need a glucagon shot (injection). A family member or friend  should learn how to check your blood sugar and how to give you a glucagon shot. Ask your doctor if you need to have a glucagon shot kit at home. Follow these instructions at home: General instructions  Take over-the-counter and prescription medicines only as told by your doctor.  Stay aware of your blood sugar as told by your doctor.  Limit alcohol intake to no more than 1 drink a day for nonpregnant women and 2 drinks a day for men. One drink equals 12 oz of beer (355 mL), 5 oz of wine (148 mL), or 1 oz of hard liquor (44 mL).  Keep all follow-up visits as told by your doctor. This is  important. If you have diabetes:   Follow your diabetes care plan as told by your doctor. Make sure you: ? Know the signs of low blood sugar. ? Take your medicines as told. ? Follow your exercise and meal plan. ? Eat on time. Do not skip meals. ? Check your blood sugar as often as told by your doctor. Always check it before and after exercise. ? Follow your sick day plan when you cannot eat or drink normally. Make this plan ahead of time with your doctor.  Share your diabetes care plan with: ? Your work or school. ? People you live with.  Check your pee (urine) for ketones: ? When you are sick. ? As told by your doctor.  Carry a card or wear jewelry that says you have diabetes. Contact a doctor if:  You have trouble keeping your blood sugar in your target range.  You have low blood sugar often. Get help right away if:  You still have symptoms after you eat or drink something sugary.  Your blood sugar is at or below 54 mg/dL (3 mmol/L).  You have jerky movements that you cannot control.  You pass out. These symptoms may be an emergency. Do not wait to see if the symptoms will go away. Get medical help right away. Call your local emergency services (911 in the [place] Do not drive yourself to the hospital. Summary  Hypoglycemia happens when the level of sugar (glucose) in your blood is too low.  Low blood sugar can happen to people who have diabetes and people who do not have diabetes. Low blood sugar can happen quickly, and it can be an emergency.  Make sure you know the signs of low blood sugar and know how to treat it.  Always keep a source of sugar (fast-acting carb) with you to treat low blood sugar. This information is not intended to replace advice given to you by your health care provider. Make sure you discuss any questions you have with your health care provider. Document Revised: 02/27/2019 Document Reviewed: 12/10/2015 Elsevier Patient Education  2020 [employer].

## 2020-05-28 NOTE — Assessment & Plan Note (Addendum)
A: Patient presents with 6 episodes of diaphoresis, shakiness, and light-headedness associated with anxiety over the last week within 30 minutes to 1 hour after eating.  Her symptoms resolve within 10 minutes after consuming sugary foods.  Symptoms are most consistent with possible post-prandial hypoglycemia, though also consider new-onset diabetes given recent thirst, urinary frequency, and weight loss and other causes of hypoglycemia including sulfonylurea abuse, insulinoma, or other endocrine pathology such as adrenal insufficiency.   Also, symptoms could be secondary to primary anxiety or panic attack disorder.  P: Will check HbA1c and capillary glucose to r/o new-onset DM and current hypoglycemia, respectively.  - Will also check CMP and TSH to r/o hyperthyroidism. - Gave patient a Contour Next One glucometer with test strips and lancets and instructed patient on its use. Advised patient to keep a log of glucose levels when she feels symptoms, noting time of day and time in relation to meals. Will follow-up at next visit. - Will schedule follow-up visit for the week of 7/19 for a lab/TRG visit for STAT BMP after patient consumes a meal if symptomatic to assess glucose at time of symptoms. - Will consider checking insulin, c-peptide, and sulfonylurea levels if lab work / home log demonstrate BG < 70. - Patient is not interested in counseling or medication for her anxiety at this time. - Patient given glucose tablets to carry at home.

## 2020-05-28 NOTE — Progress Notes (Signed)
   CC: Shaking episodes  HPI:  Elizabeth Larsen is a 19 y.o. female with no known history of diabetes or other PMHx who presents due to episodes of shakiness, sweating, light-headedness.  She states that these episodes began last week, and she has had 6 total episodes.  These episodes have occurred 30 minutes to 1 hour after eating.  She doesn't notice her symptoms occur more frequently after certain foods, but notes her last episode last night occurred after [employer].  Her episodes resolve within 5-10 minutes after eating sugary foods.  She is concerned that she may have diabetes.  She states she does not eat as much as she should, simply because she only eats when hungry.  She will typically have a sandwich for lunch and snack lightly throughout the day.  She notes a diet high in processed foods.  She denies any body dysmorphia.  She notes she was more thirsty than usual yesterday and has had urinary frequency the last 3 days without polyuria.  She notes 2 lbs of weight loss recently but no other intentional weight loss or weight gain.  She denies any nausea, vomiting, abdominal pain, syncope or pre-syncope, dizziness, insomnia, diarrhea or constipation.  She denies CP and SOB.  She notes that she is "always anxious" and states that a lot of her anxiety stems from a previous relationship but is with a new partner and feels safe at home.  She has never sought counseling or taken anxiety medications and is not interested in either option.    Past Medical History:  Diagnosis Date  . History of enlarged tonsils    Review of Systems:  All others negative except as noted in HPI.  PSHx: recent tonsillectomy and adenoidectomy  Social Hx: Patient has never used tobacco products. She vapes many times daily. She drinks alcohol only a few times per month without overuse and denies any current illicit drug use. 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    PFHx: No immediate family history of diabetes or thyroid disorders.   Vitals:   05/28/20 1527  BP: 94/63  Pulse: 76  Temp: 98.6 F (37 C)  TempSrc: Oral  SpO2: 98%  Weight: 110 lb 9.6 oz (50.2 kg)  Height: 5\' 6"  (1.676 m)   Physical Exam: Constitutional: Patient appears thin but well, in no acute distress. Eyes: No conjunctival injection. Sclera non-icteric.  HENT: Moist mucus membranes. Tonsils are surgically absent. Respiratory: Lungs are clear to auscultation, bilaterally. No wheezes, rales, or rhonchi. No tachypnea. Cardiovascular: Regular rate and rhythm. No murmurs, rubs, or gallops. Distal pulses are intact in bilateral lower extremities. No lower extremity edema. Abdominal: Abdomen is scaphoid and non-tender with no palpable masses, rebound or guarding. Bowel sounds intact. Skin: No lesions notes. No jaundice.  Assessment & Plan:   See Encounters Tab for problem based charting.  Patient seen with Dr. .  Mikey Bussing, PGY1 [facility] Internal Medicine  Pager: [phone]

## 2020-05-29 LAB — HEMOGLOBIN A1C
Est. average glucose Bld gHb Est-mCnc: 100 mg/dL
Hgb A1c MFr Bld: 5.1 % (ref 4.8–5.6)

## 2020-05-29 LAB — CBC WITH DIFFERENTIAL/PLATELET
Basophils Absolute: 0 10*3/uL (ref 0.0–0.2)
Basos: 1 %
EOS (ABSOLUTE): 0.1 10*3/uL (ref 0.0–0.4)
Eos: 2 %
Hematocrit: 40.3 % (ref 34.0–46.6)
Hemoglobin: 13.9 g/dL (ref 11.1–15.9)
Immature Grans (Abs): 0 10*3/uL (ref 0.0–0.1)
Immature Granulocytes: 0 %
Lymphocytes Absolute: 1.8 10*3/uL (ref 0.7–3.1)
Lymphs: 29 %
MCH: 31.6 pg (ref 26.6–33.0)
MCHC: 34.5 g/dL (ref 31.5–35.7)
MCV: 92 fL (ref 79–97)
Monocytes Absolute: 0.6 10*3/uL (ref 0.1–0.9)
Monocytes: 9 %
Neutrophils Absolute: 3.8 10*3/uL (ref 1.4–7.0)
Neutrophils: 59 %
Platelets: 321 10*3/uL (ref 150–450)
RBC: 4.4 x10E6/uL (ref 3.77–5.28)
RDW: 12 % (ref 11.7–15.4)
WBC: 6.4 10*3/uL (ref 3.4–10.8)

## 2020-05-29 LAB — CMP14 + ANION GAP
ALT: 8 IU/L (ref 0–32)
AST: 12 IU/L (ref 0–40)
Albumin/Globulin Ratio: 2 (ref 1.2–2.2)
Albumin: 4.3 g/dL (ref 3.9–5.0)
Alkaline Phosphatase: 88 IU/L (ref 45–106)
Anion Gap: 13 mmol/L (ref 10.0–18.0)
BUN/Creatinine Ratio: 12 (ref 9–23)
BUN: 10 mg/dL (ref 6–20)
Bilirubin Total: 0.4 mg/dL (ref 0.0–1.2)
CO2: 21 mmol/L (ref 20–29)
Calcium: 9.5 mg/dL (ref 8.7–10.2)
Chloride: 105 mmol/L (ref 96–106)
Creatinine, Ser: 0.82 mg/dL (ref 0.57–1.00)
GFR calc Af Amer: 119 mL/min/{1.73_m2} (ref >59)
GFR calc non Af Amer: 103 mL/min/{1.73_m2} (ref >59)
Globulin, Total: 2.1 g/dL (ref 1.5–4.5)
Glucose: 83 mg/dL (ref 65–99)
Potassium: 4.8 mmol/L (ref 3.5–5.2)
Sodium: 139 mmol/L (ref 134–144)
Total Protein: 6.4 g/dL (ref 6.0–8.5)

## 2020-05-29 LAB — PROLACTIN: Prolactin: 6.8 ng/mL (ref 4.8–23.3)

## 2020-05-29 LAB — TSH: TSH: 0.681 u[IU]/mL (ref 0.450–4.500)

## 2020-05-29 LAB — FOLLICLE STIMULATING HORMONE: FSH: 7.3 m[IU]/mL

## 2020-05-29 LAB — LUTEINIZING HORMONE: LH: 7.4 m[IU]/mL

## 2020-05-31 NOTE — Progress Notes (Signed)
Internal Medicine Clinic Attending  I saw and evaluated the patient.  I personally confirmed the key portions of the history and exam documented by Dr. Laddie Aquas and I reviewed pertinent patient test results.  The assessment, diagnosis, and plan were formulated together and I agree with the documentation in the resident's note.  Symptoms potentially concerning for postprandial hypoglycemia.  We would need to have her come back for provocation testing.  She would need to come in next week to obtain plasma glucose, C-peptide, insulin and proinsulin levels premeal, and then every 30 minutes especially if symptoms recur.  We will coordinate this with lab.

## 2020-06-01 ENCOUNTER — Telehealth: Payer: Self-pay | Admitting: Student

## 2020-06-01 ENCOUNTER — Other Ambulatory Visit: Payer: Self-pay | Admitting: Internal Medicine

## 2020-06-01 ENCOUNTER — Other Ambulatory Visit: Payer: Self-pay | Admitting: Student

## 2020-06-01 DIAGNOSIS — R251 Tremor, unspecified: Secondary | ICD-10-CM

## 2020-06-01 NOTE — Telephone Encounter (Signed)
Spoke with patient over the phone regarding her laboratory results.  Informed patient that all results including HbA1c and capillary glucose were within normal limits.  Patient states that she has she has checked her blood glucose when feeling symptoms of hypoglycemia twice at home since her last visit.  On one occasion her blood glucose was 83, though at 12:30pm today she had a reading of 67.  She states she ate a sausage biscuit and sweet tea for breakfast at 6:30 this morning.  She later took a nap and woke up at 12:30pm feeling hypoglycemic with sugar of 67.  She ate a glucose tablet and an ice cream and symptoms resolved in about 1 hour.  She notes that she ran out of lancets and will be going to [employer] to pick up more.  She has plenty of glucose tabs left and was instructed to carry these and snacks on her throughout the day at all times.  I instructed the patient to call 911 or go to the ED if symptoms are every severe or non-responsive to sugar.  She voiced understanding and was given the clinic number to call with any concerns.  Informed her of the plan to coordinate a lab/TRG visit next Tuesday 7/20 for further bloodwork and monitoring.  Patient is agreeable with plan.  Glenford Bayley, PGY1 Internal Medicine Pager: [phone]

## 2020-06-01 NOTE — Progress Notes (Signed)
French Ana I would like to test [facility] for Post prandial hypoglycemia. Next Monday or Tuesday.    She should have Serum glucose, C peptide, Insulin and Proinsulin tested at the following interval.  Immediately prior to meal for baseline, then at 30 minute intervals after the meal.  If she develops symptoms of hypoglycemia she would need these labs rechecked immediately then have nursing treat her hypoglycemia. I would also need you to send a sulfonylurea panel (at that time).    Below is the recommended collection strategy per [employer]. Samples are collected for plasma glucose, insulin, C-peptide, and proinsulin prior to ingestion of the meal and every 30 minutes thereafter for five hours. If severe symptoms occur prior to five hours, samples for the above laboratory tests should be collected before the administration of carbohydrates (to assess for correction of symptoms). All glucose samples are sent for analysis. Analysis of the samples for insulin, C-peptide, and proinsulin may be limited to those in which plasma glucose is <60 mg/dL [7.0 mmol/L] as a cost-saving effort  We would need to make sure a triage nurse would be avalaible to institue the hypoglycemia protocol.  Dr Mikey Bussing and Dr Laddie Aquas

## 2020-06-01 NOTE — Progress Notes (Signed)
Yes sure, we briefly discussed the overall plan with tracy last week, I think Tuesday or later will be best.

## 2020-06-01 NOTE — Progress Notes (Signed)
Hey dr Mikey Bussing, tracey is gone this week on vacation. I would say give her day to adjust to being back and catching up then Tuesday she can start on this. Is this agreeable?

## 2020-06-02 NOTE — Progress Notes (Signed)
Spoke with the patient. She has sch an appt on 06/08/2020 @ 10:15 am.

## 2020-06-02 NOTE — Progress Notes (Signed)
Thank you! I spoke with patient yesterday and she is free to come in Tuesday any time.  Glenford Bayley, PGY1

## 2020-06-07 ENCOUNTER — Telehealth: Payer: Self-pay | Admitting: Student

## 2020-06-07 NOTE — Telephone Encounter (Signed)
Left a voice message on patient's personal mobile phone requesting that she come into the clinic at 8:45am tomorrow (06/08/20) for fasting labs prior to her 10:15am appointment after a meal.  Called home phone with no answer. Will try calling again tonight.  Glenford Bayley, PGY1 Internal medicine Pager: [phone]

## 2020-06-07 NOTE — Telephone Encounter (Signed)
Was able to reach patient via home phone.  Discussed with her and she will fast beginning at 8:45pm tonight and come to clinic for fasting labs at 8:45am. She will eat a meal and labs will be rechecked 1 hour (or when symptomatic hypoglycemia occurs) after eating.  She states she has had only mild hypoglycemic episodes since our last phone discussion and her blood sugars at that time were WNL (85).  Glenford Bayley, PGY1 Internal Medicine Pager: [phone]

## 2020-06-08 ENCOUNTER — Other Ambulatory Visit: Payer: Self-pay

## 2020-06-08 ENCOUNTER — Ambulatory Visit (INDEPENDENT_AMBULATORY_CARE_PROVIDER_SITE_OTHER): Payer: Medicaid Other | Admitting: Student

## 2020-06-08 ENCOUNTER — Encounter: Payer: Self-pay | Admitting: Student

## 2020-06-08 DIAGNOSIS — F419 Anxiety disorder, unspecified: Secondary | ICD-10-CM

## 2020-06-08 DIAGNOSIS — E162 Hypoglycemia, unspecified: Secondary | ICD-10-CM

## 2020-06-08 DIAGNOSIS — R251 Tremor, unspecified: Secondary | ICD-10-CM

## 2020-06-08 DIAGNOSIS — E161 Other hypoglycemia: Secondary | ICD-10-CM | POA: Insufficient documentation

## 2020-06-08 LAB — BETA-HYDROXYBUTYRIC ACID
Beta-Hydroxybutyric Acid: 0.1 mmol/L (ref 0.05–0.27)
Beta-Hydroxybutyric Acid: 0.09 mmol/L (ref 0.05–0.27)

## 2020-06-08 LAB — GLUCOSE, CAPILLARY
Glucose-Capillary: 82 mg/dL (ref 70–99)
Glucose-Capillary: 44 mg/dL — CL (ref 70–99)
Glucose-Capillary: 95 mg/dL (ref 70–99)

## 2020-06-08 MED ORDER — GLUCOSE 40 % PO GEL: 1.0000 | Freq: Once | ORAL | Status: DC

## 2020-06-08 NOTE — Progress Notes (Signed)
Hypoglycemic Event  CBG: 44  Treatment: 2 tubes of Glutose 15 gel.   Symptoms: Shaky; stated she does not feel right.  Follow-up CBG: Time: 1110 AM  CBG Result: 95  Possible Reasons for Event: Pt did eat breakfast @ 0939 AM.  Comments/MD notified: Yes, Dr Laddie Aquas. Also Sprite given, per pt's request, continues to drink on.    Chinita Pester H

## 2020-06-08 NOTE — Assessment & Plan Note (Signed)
A: Patient states that she has always been one to worry and endorses anxiety. GAD score of 8. Patient states that her anxiety is centered around conversations with an ex-boyfriend. She states she may be interested in medication in the future but is not interested in counseling.   P: Will follow up hypoglycemic workup to see if low blood sugars may be contributing to her anxiety.  - If interested in future visit, may consider adding medication.

## 2020-06-08 NOTE — Patient Instructions (Signed)
Today, we discussed that your hypoglycemia may be due to many causes that we will work through with you when test results return.  Please continue to carry glucose tablets and/or sugary snacks with you at all times in case hypoglycemic episode occurs.  Please continue to monitor blood sugars at home when symptomatic, documenting timing in relation to meals.  I will call with results as soon as they are available and will schedule your next visit to see me in clinic at that time.  Thank you,  Dr. Jeralyn Bennett   Hypoglycemia Hypoglycemia is when the sugar (glucose) level in your blood is too low. Signs of low blood sugar may include:  Feeling: ? Hungry. ? Worried or nervous (anxious). ? Sweaty and clammy. ? Confused. ? Dizzy. ? Sleepy. ? Sick to your stomach (nauseous).  Having: ? A fast heartbeat. ? A headache. ? A change in your vision. ? Tingling or no feeling (numbness) around your mouth, lips, or tongue. ? Jerky movements that you cannot control (seizure).  Having trouble with: ? Moving (coordination). ? Sleeping. ? Passing out (fainting). ? Getting upset easily (irritability). Low blood sugar can happen to people who have diabetes and people who do not have diabetes. Low blood sugar can happen quickly, and it can be an emergency. Treating low blood sugar Low blood sugar is often treated by eating or drinking something sugary right away, such as:  Fruit juice, 4-6 oz (120-150 mL).  Regular soda (not diet soda), 4-6 oz (120-150 mL).  Low-fat milk, 4 oz (120 mL).  Several pieces of hard candy.  Sugar or honey, 1 Tbsp (15 mL). Treating low blood sugar if you have diabetes If you can think clearly and swallow safely, follow the 15:15 rule:  Take 15 grams of a fast-acting carb (carbohydrate). Talk with your doctor about how much you should take.  Always keep a source of fast-acting carb with you, such as: ? Sugar tablets (glucose pills). Take 3-4 pills. ? 6-8  pieces of hard candy. ? 4-6 oz (120-150 mL) of fruit juice. ? 4-6 oz (120-150 mL) of regular (not diet) soda. ? 1 Tbsp (15 mL) honey or sugar.  Check your blood sugar 15 minutes after you take the carb.  If your blood sugar is still at or below 70 mg/dL (3.9 mmol/L), take 15 grams of a carb again.  If your blood sugar does not go above 70 mg/dL (3.9 mmol/L) after 3 tries, get help right away.  After your blood sugar goes back to normal, eat a meal or a snack within 1 hour.  Treating very low blood sugar If your blood sugar is at or below 54 mg/dL (3 mmol/L), you have very low blood sugar (severe hypoglycemia). This may also cause:  Passing out.  Jerky movements you cannot control (seizure).  Losing consciousness (coma). This is an emergency. Do not wait to see if the symptoms will go away. Get medical help right away. Call your local emergency services (911 in the [place] Do not drive yourself to the hospital. If you have very low blood sugar and you cannot eat or drink, you may need a glucagon shot (injection). A family member or friend should learn how to check your blood sugar and how to give you a glucagon shot. Ask your doctor if you need to have a glucagon shot kit at home. Follow these instructions at home: General instructions  Take over-the-counter and prescription medicines only as told by your doctor.  Stay  aware of your blood sugar as told by your doctor.  Limit alcohol intake to no more than 1 drink a day for nonpregnant women and 2 drinks a day for men. One drink equals 12 oz of beer (355 mL), 5 oz of wine (148 mL), or 1 oz of hard liquor (44 mL).  Keep all follow-up visits as told by your doctor. This is important. If you have diabetes:   Follow your diabetes care plan as told by your doctor. Make sure you: ? Know the signs of low blood sugar. ? Take your medicines as told. ? Follow your exercise and meal plan. ? Eat on time. Do not skip meals. ? Check your  blood sugar as often as told by your doctor. Always check it before and after exercise. ? Follow your sick day plan when you cannot eat or drink normally. Make this plan ahead of time with your doctor.  Share your diabetes care plan with: ? Your work or school. ? People you live with.  Check your pee (urine) for ketones: ? When you are sick. ? As told by your doctor.  Carry a card or wear jewelry that says you have diabetes. Contact a doctor if:  You have trouble keeping your blood sugar in your target range.  You have low blood sugar often. Get help right away if:  You still have symptoms after you eat or drink something sugary.  Your blood sugar is at or below 54 mg/dL (3 mmol/L).  You have jerky movements that you cannot control.  You pass out. These symptoms may be an emergency. Do not wait to see if the symptoms will go away. Get medical help right away. Call your local emergency services (911 in the [place] Do not drive yourself to the hospital. Summary  Hypoglycemia happens when the level of sugar (glucose) in your blood is too low.  Low blood sugar can happen to people who have diabetes and people who do not have diabetes. Low blood sugar can happen quickly, and it can be an emergency.  Make sure you know the signs of low blood sugar and know how to treat it.  Always keep a source of sugar (fast-acting carb) with you to treat low blood sugar. This information is not intended to replace advice given to you by your health care provider. Make sure you discuss any questions you have with your health care provider. Document Revised: 02/27/2019 Document Reviewed: 12/10/2015 Elsevier Patient Education  2020 [employer].

## 2020-06-08 NOTE — Assessment & Plan Note (Addendum)
A: Blood glucose today of 44 in office after eating a large, sugary, fatty meal. Given symptoms of hypoglycemia (jitteriness, light-headedness, sweating) occurring more frequently 30 minutes to 1 hour after meals, post-prandial hypoglycemia is most likely. Insulinoma vs. Sulfonylurea use vs. Autoimmune processes are also on the differential.   P: Obtained both fasting and 1-hr post-prandial beta-hydroxybutyrate, c-peptide, random insulin, pro-insulin, capillary glucose, and serum glucose levels.  - Also obtained 1-hr post-prandial insulin antibodies and sulfonylurea/meglitinide panel.  - Will follow up results  - Advised patient to consume more frequent, smaller meals throughout the day - Encouraged her to eat more fiber (oatmeal, fruits) to level her blood glucose throughout the day - Instructed her to always carry glucose tablets vs. Sugary snacks in case of hypoglycemic episode - Patient has been instructed to go to the ED if she experiences hypoglycemia that does not respond to sugar

## 2020-06-08 NOTE — Progress Notes (Signed)
   CC: Jitteriness  HPI:  [name] is a 19 y.o. underweight but otherwise healthy female returning to clinic for hypoglycemia follow up. She was first seen on 05/28/20 for 1 week of symptoms concerning for hypoglycemia including shaking, light-headedness, and sweating that she noted tended to occur 30 minutes to 1 hour after eating. She was given a glucometer and advised to check her sugars when symptomatic at home. She did have one low reading of 67 on 06/01/20 6 hours after breakfast, but otherwise, readings have been between 85-101 the past week. The patient was seen this morning for fasting labs and then I took her to the cafeteria to have breakfast prior to returning for lab draws. She ate a sausage gravy biscuit and Dr. Reino Kent. Within 1 hour after eating, she developed symptoms including light-headedness, jitteriness, and leg-heaviness and her sugar at the time was 44. She quickly improved with glucose back to 95 after glucose tablets and sprite. She notes that she has lost 2 more lbs since 05/28/20. She does note that she has always been one to worry and would be open to medication in the future but is not interested in counseling services.  Past Medical History:  Diagnosis Date  . History of enlarged tonsils    Surgical Hx: Tonsillectomy  Social Hx: Patient vapes every day throughout the day. She does not smoke cigarettes. She drinks alcohol socially and denies other drug use.  Fam Hx: No family history of thyroid disorders, DM or hypoglycemia.  Review of Systems: Positive for anxiety and CP associated with anxiety, positive for abdominal pain yesterday (resolved). All others negative except as noted in HPI. Specifically no nausea, vomiting, diarrhea, constipation, change in appetite, hair loss.   Vitals:   06/08/20 0853  BP: 113/75  Pulse: 64  Temp: 98.2 F (36.8 C)  TempSrc: Oral  SpO2: 100%  Weight: 108 lb 9.6 oz (49.3 kg)  Height: 5\' 6"  (1.676 m)   Physical  Exam: Constitutional: Patient appears thin but well. No acute distress. Eyes: No conjunctival injection. Sclera non-icteric.  HENT: Moist mucus membranes. No oral lesions. Respiratory: Lungs are clear to auscultation, bilaterally. No wheezes, rales, or rhonchi.  Cardiovascular: Regular rate and rhythm. No murmurs, rubs, or gallops. No lower extremity edema. Distal pulses 2+ in all four extremities. Skin: No lesions notes. No jaundice. Psychiatric: Patient appears apprehensive and anxious. No SI.   Assessment & Plan:   See Encounters Tab for problem based charting.  Patient seen with Dr. .  Mayford Knife, PGY1 [facility]  Pager: [phone]

## 2020-06-09 LAB — INSULIN, RANDOM: INSULIN: 11.1 u[IU]/mL (ref 2.6–24.9)

## 2020-06-09 LAB — PROINSULIN: Proinsulin: 1.6 pmol/L (ref 0.0–10.0)

## 2020-06-09 LAB — GLUCOSE, RANDOM: Glucose: 95 mg/dL (ref 65–99)

## 2020-06-09 LAB — C-PEPTIDE: C-Peptide: 2.1 ng/mL (ref 1.1–4.4)

## 2020-06-09 NOTE — Progress Notes (Signed)
Internal Medicine Clinic Attending  I saw and evaluated the patient.  I personally confirmed the key portions of the history and exam documented by Dr. Speakman and I reviewed pertinent patient test results.  The assessment, diagnosis, and plan were formulated together and I agree with the documentation in the resident's note.  

## 2020-06-14 ENCOUNTER — Other Ambulatory Visit: Payer: Self-pay | Admitting: Student

## 2020-06-14 ENCOUNTER — Telehealth: Payer: Self-pay | Admitting: Student

## 2020-06-14 DIAGNOSIS — R1900 Intra-abdominal and pelvic swelling, mass and lump, unspecified site: Secondary | ICD-10-CM

## 2020-06-14 NOTE — Telephone Encounter (Signed)
Pls contact pt regarding results [phone]

## 2020-06-14 NOTE — Telephone Encounter (Signed)
I spoke with Ms. Bow regarding her lab results, explaining that her elevated C-peptide could mean one of three things: insulin autoimmune hypoglycemia (essentially ruled out given negative insulin Ab), oral sulfonylurea/meglitinide ingestion, or insulinoma vs. Other pancreatic insulin-overproduction disease (ex. NIPHS).  I spoke with radiology prior to calling the patient, who recommended MRI with and without contrast as the best method of localizing an insulinoma for her. I informed her of this and she agrees to the procedure, which I have referred her for here at [facility].  She has had two hypoglycemic episodes today which improved with sugar. She has no other questions or concerns at this time.   Glenford Bayley, PGY1 Internal Medicine Pager: [phone]

## 2020-06-15 ENCOUNTER — Telehealth: Payer: Self-pay

## 2020-06-15 NOTE — Telephone Encounter (Signed)
rtc to pt, states she found a knot on her neck this am between her chin and collar bone appt 7/29 at [facility]

## 2020-06-15 NOTE — Telephone Encounter (Signed)
Requesting to speak with a nurse about knot on the neck. Offer appt to pt, pt refused. Please call pt back.

## 2020-06-17 ENCOUNTER — Encounter: Payer: Self-pay | Admitting: Student

## 2020-06-17 ENCOUNTER — Ambulatory Visit (INDEPENDENT_AMBULATORY_CARE_PROVIDER_SITE_OTHER): Payer: Medicaid Other | Admitting: Student

## 2020-06-17 ENCOUNTER — Other Ambulatory Visit: Payer: Self-pay

## 2020-06-17 VITALS — BP 108/68 | HR 76 | Temp 98.0°F | Ht 66.0 in | Wt 109.4 lb

## 2020-06-17 DIAGNOSIS — R591 Generalized enlarged lymph nodes: Secondary | ICD-10-CM | POA: Diagnosis not present

## 2020-06-17 DIAGNOSIS — E162 Hypoglycemia, unspecified: Secondary | ICD-10-CM | POA: Diagnosis not present

## 2020-06-17 NOTE — Assessment & Plan Note (Signed)
A: Patient states that she has not had hypoglycemia on blood testing over the past several days. She denies any current symptoms of shakiness, diaphoresis, confusion, or other symptoms of hypoglycemia.   P: Referral for abdominal MRI w and wo contrast to r/o insulinoma was ordered last visit. Patient has not yet received a call to schedule this.  - Patient will call after MRI is scheduled for follow-up appointment.

## 2020-06-17 NOTE — Progress Notes (Signed)
   CC: right neck mass  HPI:  [name] is a 19 y.o. female with recent presentations for hypoglycemia presenting today for a right neck mass. She notes a small, mobile, tender, palpable mass along her right posterior neck. She first noticed this 2 days ago. She notes she has had a headache for 3 days which resolved this morning and notes a mild sore throat. She denies any fevers, chills, cough, congestion, rhinorrhea, ear pain. She does note she has had intermittent left-sided upper abdominal cramping lasting a few seconds at a time over the past 3 days as well but denies any nausea, vomiting, or heartburn. No changes in stools.   Past Medical History:  Diagnosis Date  . History of enlarged tonsils   . Hypoglycemia    Surgical Hx: Patient had a tonsillectomy and adenoidectomy earlier this year.   Social Hx: No sick contacts. Patient vapes daily but does not use tobacco products. Rare alcohol use and no other drug use.   Allergies: NKDA   Review of Systems:  All others negative except as noted in HPI above.    Vitals:   06/17/20 0852  BP: 108/68  Pulse: 76  Temp: 98 F (36.7 C)  TempSrc: Other (Comment)  SpO2: 100%  Weight: 109 lb 5.9 oz (49.6 kg)  Height: 5\' 6"  (1.676 m)   Physical Exam: Constitutional: Patient appears thin but well. No acute distress.  Eyes: No conjunctival injection. Sclera non-icteric.  HENT: Mild pharyngeal erythema is present without exudates. Tonsils have been surgically removed. There is a pea-sized, mobile, tender lymph node posterior to the sternocleidomastoid muscle on the right. Moist mucus membranes.  Respiratory: Lungs are clear to auscultation, bilaterally. No wheezes, rales, or rhonchi. Cardiovascular: Regular rate and rhythm. No murmurs, rubs, or gallops. No lower extremity edema.  Abdominal: There is mild epigastric tenderness to palpation without guarding or rebound. No other abdominal tenderness. Bowel sounds intact.  Skin: No  lesions notes. No jaundice.  Bedside ultrasound of the right next region was performed. No enlarged lymph nodes or other cysts or masses were visualized.   Assessment & Plan:   See Encounters Tab for problem based charting.  Patient seen with Dr. .  Oswaldo Done, PGY1 [facility]  Pager: [phone]

## 2020-06-17 NOTE — Assessment & Plan Note (Addendum)
A: Patient has a single, mobile, tender, pea-sized enlarged lymph node palpable posterior to the right sternocleidomastoid muscle. She also has had recent headache and mild sore throat without upper respiratory symptoms or fever, consistent with likely viral illness.   P: Supportive care - patient encouraged to continue oral intake, drinking plenty of fluids, and ibuprofen PRN for pain.

## 2020-06-17 NOTE — Patient Instructions (Signed)
Today, we discussed that your tender mass on your neck is likely a swollen lymph node in the setting of a viral illness. Please take ibuprofen as needed for pain.  Please call [phone] if you experience additional troubles with priorauthorization of your MRI.   Please call to schedule an appointment for after you are scheduled for your MRI.  Thank you,  Dr. Glenford Bayley   Lymphadenopathy  Lymphadenopathy means that your lymph glands are swollen or larger than normal (enlarged). Lymph glands, also called lymph nodes, are collections of tissue that filter bacteria, viruses, and waste from your bloodstream. They are part of your body's disease-fighting system (immune system), which protects your body from germs. There may be different causes of lymphadenopathy, depending on where it is in your body. Some types go away on their own. Lymphadenopathy can occur anywhere that you have lymph glands, including these areas:  Neck (cervical lymphadenopathy).  Chest (mediastinal lymphadenopathy).  Lungs (hilar lymphadenopathy).  Underarms (axillary lymphadenopathy).  Groin (inguinal lymphadenopathy). When your immune system responds to germs, infection-fighting cells and fluid build up in your lymph glands. This causes some swelling and enlargement. If the lymph glands do not go back to normal after you have an infection or disease, your health care provider may do tests. These tests help to monitor your condition and find the reason why the glands are still swollen and enlarged. Follow these instructions at home:  Get plenty of rest.  Take over-the-counter and prescription medicines only as told by your health care provider. Your health care provider may recommend over-the-counter medicines for pain.  If directed, apply heat to swollen lymph glands as often as told by your health care provider. Use the heat source that your health care provider recommends, such as a moist heat pack or a  heating pad. ? Place a towel between your skin and the heat source. ? Leave the heat on for 20-30 minutes. ? Remove the heat if your skin turns bright red. This is especially important if you are unable to feel pain, heat, or cold. You may have a greater risk of getting burned.  Check your affected lymph glands every day for changes. Check other lymph gland areas as told by your health care provider. Check for changes such as: ? More swelling. ? Sudden increase in size. ? Redness or pain. ? Hardness.  Keep all follow-up visits as told by your health care provider. This is important. Contact a health care provider if you have:  Swelling that gets worse or spreads to other areas.  Problems with breathing.  Lymph glands that: ? Are still swollen after 2 weeks. ? Have suddenly gotten bigger. ? Are red, painful, or hard.  A fever or chills.  Fatigue.  A sore throat.  Pain in your abdomen.  Weight loss.  Night sweats. Get help right away if you have:  Fluid leaking from an enlarged lymph gland.  Severe pain.  Chest pain.  Shortness of breath. Summary  Lymphadenopathy means that your lymph glands are swollen or larger than normal (enlarged).  Lymph glands (also called lymph nodes) are collections of tissue that filter bacteria, viruses, and waste from the bloodstream. They are part of your body's disease-fighting system (immune system).  Lymphadenopathy can occur anywhere that you have lymph glands.  If your enlarged and swollen lymph glands do not go back to normal after you have an infection or disease, your health care provider may do tests to monitor your condition and find  the reason why the glands are still swollen and enlarged.  Check your affected lymph glands every day for changes. Check other lymph gland areas as told by your health care provider. This information is not intended to replace advice given to you by your health care provider. Make sure you  discuss any questions you have with your health care provider. Document Revised: 10/19/2017 Document Reviewed: 09/21/2017 Elsevier Patient Education  2020 [employer].

## 2020-06-18 LAB — SPECIMEN STATUS REPORT

## 2020-06-18 LAB — SULFONYLUREA HYPOGLYCEMICS PANEL, SERUM
Acetohexamide: NEGATIVE ug/mL (ref 20–60)
Chlorpropamide: NEGATIVE ug/mL (ref 75–250)
Glimepiride: NEGATIVE ng/mL (ref 80–250)
Glipizide: NEGATIVE ng/mL (ref 200–1000)
Glyburide: NEGATIVE ng/mL
Nateglinide: NEGATIVE ng/mL
Repaglinide: NEGATIVE ng/mL
Tolazamide: NEGATIVE ug/mL
Tolbutamide: NEGATIVE ug/mL (ref 40–100)

## 2020-06-18 LAB — PROINSULIN: Proinsulin: 6.5 pmol/L (ref 0.0–10.0)

## 2020-06-18 LAB — INSULIN ANTIBODIES, BLOOD: Insulin AutoAb: <5 uU/mL

## 2020-06-18 LAB — GLUCOSE, RANDOM: Glucose: 50 mg/dL — ABNORMAL LOW (ref 65–99)

## 2020-06-18 LAB — C-PEPTIDE: C-Peptide: 6.2 ng/mL — ABNORMAL HIGH (ref 1.1–4.4)

## 2020-06-18 LAB — INSULIN, RANDOM: INSULIN: 22.7 u[IU]/mL (ref 2.6–24.9)

## 2020-06-18 NOTE — Progress Notes (Signed)
Internal Medicine Clinic Attending  I saw and evaluated the patient.  I personally confirmed the key portions of the history and exam documented by Dr. Speakman and I reviewed pertinent patient test results.  The assessment, diagnosis, and plan were formulated together and I agree with the documentation in the resident's note.  

## 2020-07-02 ENCOUNTER — Other Ambulatory Visit: Payer: Self-pay | Admitting: Internal Medicine

## 2020-07-02 ENCOUNTER — Other Ambulatory Visit: Payer: Self-pay

## 2020-07-02 ENCOUNTER — Ambulatory Visit (HOSPITAL_COMMUNITY)
Admission: RE | Admit: 2020-07-02 | Discharge: 2020-07-02 | Disposition: A | Discharge status: Home / Self Care | Payer: Medicaid Other | Source: Ambulatory Visit | Attending: Internal Medicine | Admitting: Internal Medicine

## 2020-07-02 DIAGNOSIS — R1900 Intra-abdominal and pelvic swelling, mass and lump, unspecified site: Secondary | ICD-10-CM

## 2020-07-02 DIAGNOSIS — Z0389 Encounter for observation for other suspected diseases and conditions ruled out: Secondary | ICD-10-CM | POA: Diagnosis not present

## 2020-07-02 DIAGNOSIS — R935 Abnormal findings on diagnostic imaging of other abdominal regions, including retroperitoneum: Secondary | ICD-10-CM | POA: Diagnosis not present

## 2020-07-02 MED ORDER — GADOBUTROL 1 MMOL/ML IV SOLN
5.0000 mL | Freq: Once | INTRAVENOUS | Status: AC | PRN
  Administered 2020-07-02: 5 mL via INTRAVENOUS

## 2020-07-06 ENCOUNTER — Ambulatory Visit (INDEPENDENT_AMBULATORY_CARE_PROVIDER_SITE_OTHER): Payer: Medicaid Other | Admitting: Internal Medicine

## 2020-07-06 ENCOUNTER — Encounter: Payer: Self-pay | Admitting: Internal Medicine

## 2020-07-06 ENCOUNTER — Other Ambulatory Visit: Payer: Self-pay

## 2020-07-06 VITALS — BP 121/82 | HR 62 | Temp 98.2°F | Ht 66.0 in | Wt 111.8 lb

## 2020-07-06 DIAGNOSIS — E161 Other hypoglycemia: Secondary | ICD-10-CM

## 2020-07-06 DIAGNOSIS — E162 Hypoglycemia, unspecified: Secondary | ICD-10-CM

## 2020-07-06 NOTE — Patient Instructions (Signed)
It was nice seeing you today! Thank you for choosing [facility] for your Primary Care.    Today we talked about:   1. Low sugars (Hypogylcemia): We are referring you to the specialists called Endocrinology to determine best next steps, including whether to order that PET-CT scan. You will be contact by either our office or the Endocrinology office to schedule this.

## 2020-07-06 NOTE — Progress Notes (Signed)
   CC: Hypoglycemia follow up  HPI:  [name] is a 19 y.o. with a PMHx of as listed below who presents to the clinic for hypoglycemia follow up.   Please see the Encounters tab for problem-based Assessment & Plan regarding status of patient's acute and chronic conditions.  Past Medical History:  Diagnosis Date  . History of enlarged tonsils   . Hypoglycemia    Review of Systems: Review of Systems  Constitutional: Negative for chills and fever.  Respiratory: Negative for shortness of breath and wheezing.   Cardiovascular: Negative for chest pain and palpitations.  Neurological: Positive for dizziness (when sugars are low). Negative for focal weakness and weakness.   Physical Exam:  Vitals:   07/06/20 1557  BP: 121/82  Pulse: 62  Temp: 98.2 F (36.8 C)  TempSrc: Oral  SpO2: 100%  Weight: 111 lb 12.8 oz (50.7 kg)  Height: 5\' 6"  (1.676 m)   Physical Exam Vitals and nursing note reviewed.  Constitutional:      General: She is not in acute distress.    Appearance: She is normal weight.  Pulmonary:     Effort: Pulmonary effort is normal. No respiratory distress.  Skin:    General: Skin is warm and dry.  Neurological:     General: No focal deficit present.     Mental Status: She is alert and oriented to person, place, and time. Mental status is at baseline.  Psychiatric:        Mood and Affect: Mood normal.        Behavior: Behavior normal.    Assessment & Plan:   See Encounters Tab for problem based charting.  Patient discussed with Dr. 

## 2020-07-07 NOTE — Assessment & Plan Note (Addendum)
Abisai states she continues to have episodes of jitteriness, shakiness and dizziness that occurs after eating that she has attributed to low sugars. She is not checking her sugar at these moments though. Symptoms are alleviated by glucose tablets and she makes sure to always have these on her. She denies any LOC, seizure activity, focal weakness, palpitations, chest pain, SOB.   Assessment/Plan:  Patient had postprandial hypoglycemia that was confirmed with in-office CBG post-meal. Lab work was positive for elevated post-prandial C-peptide at 6.2 however insulin levels were normal pre and post prandially. Pro-insulin levels consistently WNL. Negative for sulfonylureas. Negative for insulin antibodies.   Differential initially included insulinoma versus non-insulinoma pancreatogenous hypoglycemia syndrome. MRI was obtained and negative for any intra-abdominal abnormalities. Will refer to endocrinology at this time for further evaluation prior to ordering additional imaging.   - Referral to Endocrinology - Recommended she continue to keep glucose tablets on hand and avoid driving post-prandially

## 2020-07-09 NOTE — Progress Notes (Signed)
Internal Medicine Clinic Attending  I saw and evaluated the patient.  I personally confirmed the key portions of the history and exam documented by Dr. Basaraba and I reviewed pertinent patient test results.  The assessment, diagnosis, and plan were formulated together and I agree with the documentation in the resident's note.    

## 2020-07-13 ENCOUNTER — Telehealth: Payer: Self-pay | Admitting: *Deleted

## 2020-07-13 ENCOUNTER — Telehealth: Payer: Self-pay | Admitting: Student

## 2020-07-13 NOTE — Telephone Encounter (Signed)
Pt requesting a call back about her blood sugars dropping.  Please call patient back.

## 2020-07-13 NOTE — Telephone Encounter (Signed)
Thank you for letting us know.  

## 2020-07-13 NOTE — Telephone Encounter (Signed)
RTC, patient states she had "another hypoglycemic episode 2 nights ago w/ dizziness, shaking, and weakness".  She is wanting to know the status of the referral placed to Endocrinology.  RN asked if patient took her glucose tablets as directed per LOV note, patient states she did not have them with her, so she drank orange juice and ate something.  Patient states she began to feel better quickly.  RN reiterated to patient the importance of having glucose tablets with her at all times and she verbalized understanding.  Patient denies any hypglycemic episodes since 2 nights ago.  RN asked Lela about status of referral, and Lela was actually on the phone with [facility] endocrinology at the time regarding this referral.  [facility] is requesting records be faxed again, as they are only receiving 3 pages, even though multiple pages have been confirmed via epic-fax .  Lela currently faxing records again and states she will also contact Epic.  Will forward to appropriate team to make them aware of additional hypoglycemic episode and for any other additional recommendations. Thank you, SChaplin, RN,BSN

## 2020-07-13 NOTE — Telephone Encounter (Signed)
CALLED PATIENT LEFT MESSAGE WITH GRAND FATHER FOR HER TO RETURN CALL TO CLINIC. THIS IS IN REGARDS TO HER REFERRAL.

## 2020-07-20 ENCOUNTER — Telehealth: Payer: Self-pay | Admitting: Student

## 2020-07-20 NOTE — Telephone Encounter (Signed)
Pls contact regarding pain in stomach [phone]

## 2020-07-20 NOTE — Telephone Encounter (Signed)
Thank you. Agree that she needs to be evaluated. If sooner appointment opens up, I would advise moving her appointment up.

## 2020-07-20 NOTE — Telephone Encounter (Signed)
Return pt's call - stated she went to the beach over the w/e; had stomach pains after eating - she could not have a bm and she did not throw up. And she has been feeling sick after eating;she thinks it might be her pancreas. This morning she was having sharp cramping pain and abd is distended. No available appts until Friday; appt scheduled 9/3 @ 1345 PM. Pt instructed if her symptoms worsen to go to UC - she understands.

## 2020-07-23 ENCOUNTER — Other Ambulatory Visit: Payer: Self-pay

## 2020-07-23 ENCOUNTER — Ambulatory Visit (INDEPENDENT_AMBULATORY_CARE_PROVIDER_SITE_OTHER): Payer: Medicaid Other | Admitting: Internal Medicine

## 2020-07-23 ENCOUNTER — Encounter: Payer: Self-pay | Admitting: Internal Medicine

## 2020-07-23 VITALS — BP 102/62 | HR 72 | Temp 98.1°F | Ht 66.0 in | Wt 110.5 lb

## 2020-07-23 DIAGNOSIS — R140 Abdominal distension (gaseous): Secondary | ICD-10-CM | POA: Diagnosis not present

## 2020-07-23 DIAGNOSIS — R1013 Epigastric pain: Secondary | ICD-10-CM

## 2020-07-23 DIAGNOSIS — R1031 Right lower quadrant pain: Secondary | ICD-10-CM | POA: Diagnosis not present

## 2020-07-23 MED ORDER — FAMOTIDINE 20 MG PO TABS: 20.0000 mg | ORAL_TABLET | Freq: Two times a day (BID) | ORAL | 0 refills | Status: DC

## 2020-07-23 NOTE — Progress Notes (Signed)
   CC: Abdominal bloating   HPI:  Elizabeth Larsen is a 19 y.o. woman with medical history significant for postprandial hypoglycemia presenting for evaluation of abdominal bloating.  Please see problem based charting for further details.  Past Medical History:  Diagnosis Date  . History of enlarged tonsils   . Hypoglycemia    Review of Systems:  As per HPI  Physical Exam:  Vitals:   07/23/20 1357  BP: 102/62  Pulse: 72  Temp: 98.1 F (36.7 C)  TempSrc: Oral  SpO2: 99%  Weight: 110 lb 8 oz (50.1 kg)  Height: 5\' 6"  (1.676 m)   Physical Exam Vitals and nursing note reviewed.  Constitutional:      Appearance: She is well-developed.  Abdominal:     General: Bowel sounds are normal.     Tenderness: Tenderness: Minimal tenderness to very deep palpation of the RLQ and epigastrium.  Skin:    General: Skin is warm and dry.  Neurological:     Mental Status: She is alert.  Psychiatric:        Mood and Affect: Mood normal.        Behavior: Behavior normal.     Assessment & Plan:   See Encounters Tab for problem based charting.  Patient discussed with Dr. 

## 2020-07-23 NOTE — Assessment & Plan Note (Addendum)
Abdominal bloating: She reports of a 1 week history of postprandial bloating sensation in her abdomen with associated epigastric burning pain.  She states that she was at the beach last week and while she was there she had 2 episodes of bloating and burning epigastrium.  She states that the episode are intermittent.  Her first episode lasted for the entire day and her second episode self resolved within moments.  She also describes abdominal cramps as well as nausea.  She denies fevers, chills, vomiting, constipation, diarrhea, association with dairy products.  She states that she is worried she is pregnant.  Home urine pregnancy test was normal.  She is currently on oral contraceptive and her menses have ceased.  On physical exams, abdominal exam is completely benign with no signs of acute abdomen.  Upon chart review, she had a CMP, CBC and abdominal MRI recently which were all unremarkable.  A/P: DDx IBS vs GERD vs ?Lactose intolerance vs ?Gluten sensitivity   Plan: -Follow-up CMP, quantitative beta-hCG, Lipase -Start trial of famotidine

## 2020-07-23 NOTE — Progress Notes (Signed)
Internal Medicine Clinic Attending  I saw and evaluated the patient.  I personally confirmed the key portions of the history and exam documented by Dr. Agyei and I reviewed pertinent patient test results.  The assessment, diagnosis, and plan were formulated together and I agree with the documentation in the resident's note.  

## 2020-07-23 NOTE — Patient Instructions (Signed)
Elizabeth Larsen,   It was a pleasure taking care of you today. I am ordering blood work to check on your kidneys and liver. I will also obtain a pregnancy test as well.   I will also start you on a medicine for the burning stomach pain.   Take care! Dr. Dortha Schwalbe  Please call the internal medicine center clinic if you have any questions or concerns, we may be able to help and keep you from a long and expensive emergency room wait. Our clinic and after hours phone number is [phone], the best time to call is Monday through Friday 9 am to 4 pm but there is always someone available 24/7 if you have an emergency. If you need medication refills please notify your pharmacy one week in advance and they will send [place] a request.

## 2020-07-23 NOTE — Addendum Note (Signed)
Addended by: Yvette Rack on: 07/23/2020 04:00 PM   Modules accepted: Orders

## 2020-07-24 ENCOUNTER — Encounter: Payer: Self-pay | Admitting: Internal Medicine

## 2020-07-24 LAB — CMP14 + ANION GAP
ALT: 8 IU/L (ref 0–32)
AST: 16 IU/L (ref 0–40)
Albumin/Globulin Ratio: 2.1 (ref 1.2–2.2)
Albumin: 4.6 g/dL (ref 3.9–5.0)
Alkaline Phosphatase: 66 IU/L (ref 45–106)
Anion Gap: 15 mmol/L (ref 10.0–18.0)
BUN/Creatinine Ratio: 7 — ABNORMAL LOW (ref 9–23)
BUN: 5 mg/dL — ABNORMAL LOW (ref 6–20)
Bilirubin Total: 0.3 mg/dL (ref 0.0–1.2)
CO2: 22 mmol/L (ref 20–29)
Calcium: 9.3 mg/dL (ref 8.7–10.2)
Chloride: 104 mmol/L (ref 96–106)
Creatinine, Ser: 0.72 mg/dL (ref 0.57–1.00)
GFR calc Af Amer: 139 mL/min/{1.73_m2} (ref >59)
GFR calc non Af Amer: 121 mL/min/{1.73_m2} (ref >59)
Globulin, Total: 2.2 g/dL (ref 1.5–4.5)
Glucose: 85 mg/dL (ref 65–99)
Potassium: 4.6 mmol/L (ref 3.5–5.2)
Sodium: 141 mmol/L (ref 134–144)
Total Protein: 6.8 g/dL (ref 6.0–8.5)

## 2020-07-24 LAB — BETA HCG QUANT (REF LAB): hCG Quant: <1 m[IU]/mL

## 2020-07-24 LAB — LIPASE: Lipase: 26 U/L (ref 14–72)

## 2020-07-27 ENCOUNTER — Telehealth: Payer: Self-pay | Admitting: Student

## 2020-07-27 NOTE — Telephone Encounter (Signed)
Return pt's call - grandmother stated pt is not at home. Cell phone# given - [phone].  Called pt [phone]) - she stated someone had called her, so she called back; stated she spoke to someone.

## 2020-07-27 NOTE — Telephone Encounter (Signed)
Pls contact pt  [phone]

## 2020-08-02 ENCOUNTER — Encounter: Payer: Self-pay | Admitting: Adult Health

## 2020-08-02 ENCOUNTER — Other Ambulatory Visit (HOSPITAL_COMMUNITY)
Admission: RE | Admit: 2020-08-02 | Discharge: 2020-08-02 | Disposition: A | Discharge status: Home / Self Care | Payer: Medicaid Other | Source: Ambulatory Visit | Attending: Adult Health | Admitting: Adult Health

## 2020-08-02 ENCOUNTER — Other Ambulatory Visit: Payer: Self-pay

## 2020-08-02 ENCOUNTER — Ambulatory Visit (INDEPENDENT_AMBULATORY_CARE_PROVIDER_SITE_OTHER): Payer: Medicaid Other | Admitting: Adult Health

## 2020-08-02 VITALS — BP 111/78 | HR 68 | Ht 66.0 in | Wt 112.0 lb

## 2020-08-02 DIAGNOSIS — R102 Pelvic and perineal pain: Secondary | ICD-10-CM | POA: Diagnosis not present

## 2020-08-02 DIAGNOSIS — Z3202 Encounter for pregnancy test, result negative: Secondary | ICD-10-CM | POA: Insufficient documentation

## 2020-08-02 DIAGNOSIS — Z30016 Encounter for initial prescription of transdermal patch hormonal contraceptive device: Secondary | ICD-10-CM | POA: Diagnosis not present

## 2020-08-02 DIAGNOSIS — Z113 Encounter for screening for infections with a predominantly sexual mode of transmission: Secondary | ICD-10-CM | POA: Insufficient documentation

## 2020-08-02 DIAGNOSIS — N941 Unspecified dyspareunia: Secondary | ICD-10-CM | POA: Insufficient documentation

## 2020-08-02 DIAGNOSIS — N930 Postcoital and contact bleeding: Secondary | ICD-10-CM | POA: Diagnosis not present

## 2020-08-02 LAB — POCT URINE PREGNANCY: Preg Test, Ur: NEGATIVE

## 2020-08-02 MED ORDER — NORELGESTROMIN-ETH ESTRADIOL 150-35 MCG/24HR TD PTWK: 1.0000 | MEDICATED_PATCH | TRANSDERMAL | 12 refills | Status: DC

## 2020-08-02 NOTE — Progress Notes (Signed)
  Subjective:     Patient ID: Elizabeth Larsen, female   DOB: 05-Jun-2001, 19 y.o.   MRN: [id number]  HPI Elizabeth Larsen is a 19 year old white female,single, G0P0 in complaining of bleeding after sex and pain with sex. Has same partner for 5 months now. She stopped OCs last week, has been taking COC, and no period in about 5 months.She wants to try the patch again, has used in the past. Had had bloating and stomach pain and saw PCP and had labs and were normal. PCP is Dr Laddie Aquas.  Review of Systems Bleeding after sex Pain with sex Reviewed past medical,surgical, social and family history. Reviewed medications and allergies.     Objective:   Physical Exam BP 111/78 (BP Location: Left Arm, Patient Position: Sitting, Cuff Size: Normal)   Pulse 68   Ht 5\' 6"  (1.676 m)   Wt 112 lb (50.8 kg)   LMP 03/02/2020 (Approximate) Comment: stopped birth control last Wednesday  BMI 18.08 kg/m UPT is negative. Skin warm and dry.Pelvic: external genitalia is normal in appearance no lesions, vagina: white watery  discharge without odor,urethra has no lesions or masses noted, cervix:smooth, uterus: normal size, shape and contour, non tender, no masses felt, adnexa: no masses,+ tenderness noted. Bladder is non tender and no masses felt. CV swab obtained.   Examination chaperoned by Tuesday LPN.   [facility] - 08/02/20 1035      Pregnancy Intention Screening   Does the patient want to become pregnant in the next year? Unsure    Does the patient's partner want to become pregnant in the next year? Yes    Would the patient like to discuss contraceptive options today? Yes      Contraception Wrap Up   Current Method Oral Contraceptive    End Method Contraceptive Patch    Contraception Counseling Provided No           Assessment:     1. Urine pregnancy test negative   2. Postcoital bleeding CV swab sent  3. Dyspareunia in female Change positions with sex  4. Pelvic pain Return in about 4  weeks for GYN 08/04/20  5. Encounter for initial prescription of transdermal patch hormonal contraceptive device Will rx ortho evra patch Can place today, and use condoms for 1 month Meds ordered this encounter  Medications  . norelgestromin-ethinyl estradiol (ORTHO EVRA) 150-35 MCG/24HR transdermal patch    Sig: Place 1 patch onto the skin once a week.    Dispense:  3 patch    Refill:  12    Order Specific Question:   Supervising Provider    Answer:   [place], LUTHER H [[id number]]    6. Screening examination for STD (sexually transmitted disease) CV swab sent     Plan:     Return in 5 weeks for pap and physical

## 2020-08-03 ENCOUNTER — Telehealth: Payer: Self-pay | Admitting: Adult Health

## 2020-08-03 ENCOUNTER — Encounter: Payer: Self-pay | Admitting: Adult Health

## 2020-08-03 DIAGNOSIS — A749 Chlamydial infection, unspecified: Secondary | ICD-10-CM

## 2020-08-03 HISTORY — DX: Chlamydial infection, unspecified: A74.9

## 2020-08-03 LAB — CERVICOVAGINAL ANCILLARY ONLY
Bacterial Vaginitis (gardnerella): NEGATIVE
Candida Glabrata: NEGATIVE
Candida Vaginitis: NEGATIVE
Chlamydia: POSITIVE — AB
Comment: NEGATIVE
Comment: NEGATIVE
Comment: NEGATIVE
Comment: NEGATIVE
Comment: NEGATIVE
Comment: NORMAL
Neisseria Gonorrhea: NEGATIVE
Trichomonas: NEGATIVE

## 2020-08-03 MED ORDER — AZITHROMYCIN 500 MG PO TABS: ORAL_TABLET | ORAL | 0 refills | Status: DC

## 2020-08-03 NOTE — Telephone Encounter (Signed)
Pt aware that vaginal swab +chlamydia, will rx azithromycin 500 mg 2 po now, no sex, POC 10/19, called same in for partner Elizabeth Larsen dob 05[birth year] to [facility], he is allergic to PCN .NCCDRC sent

## 2020-08-04 ENCOUNTER — Telehealth: Payer: Self-pay | Admitting: Adult Health

## 2020-08-04 NOTE — Telephone Encounter (Signed)
Telephoned patient at home number and left message to return call.  

## 2020-08-04 NOTE — Telephone Encounter (Signed)
Patient states she is having side effects from new medicine zithromax. Patient states she is having upset stomach all through the night.

## 2020-08-04 NOTE — Telephone Encounter (Signed)
Patient returned call. Patient states stomach was upset last night. Advised patient that could be side effect of medication. Patient did finish all medication and appointment is scheduled for POC.

## 2020-08-30 ENCOUNTER — Other Ambulatory Visit: Payer: Self-pay | Admitting: Adult Health

## 2020-08-30 ENCOUNTER — Other Ambulatory Visit: Payer: Self-pay | Admitting: *Deleted

## 2020-08-30 ENCOUNTER — Ambulatory Visit (INDEPENDENT_AMBULATORY_CARE_PROVIDER_SITE_OTHER): Payer: Medicaid Other

## 2020-08-30 DIAGNOSIS — Z3A01 Less than 8 weeks gestation of pregnancy: Secondary | ICD-10-CM

## 2020-08-30 DIAGNOSIS — N941 Unspecified dyspareunia: Secondary | ICD-10-CM

## 2020-08-30 DIAGNOSIS — R102 Pelvic and perineal pain: Secondary | ICD-10-CM

## 2020-08-30 DIAGNOSIS — O3680X0 Pregnancy with inconclusive fetal viability, not applicable or unspecified: Secondary | ICD-10-CM

## 2020-08-30 NOTE — Progress Notes (Signed)
[place] 5+5 wks single IUP with YS,positive FHT 95 BPM,normal right ovary,left ovary not visualized

## 2020-09-07 ENCOUNTER — Encounter: Payer: Self-pay | Admitting: Adult Health

## 2020-09-07 ENCOUNTER — Other Ambulatory Visit: Payer: Medicaid Other | Admitting: Adult Health

## 2020-09-07 ENCOUNTER — Ambulatory Visit (INDEPENDENT_AMBULATORY_CARE_PROVIDER_SITE_OTHER): Payer: Medicaid Other | Admitting: Adult Health

## 2020-09-07 ENCOUNTER — Other Ambulatory Visit (HOSPITAL_COMMUNITY)
Admission: RE | Admit: 2020-09-07 | Discharge: 2020-09-07 | Disposition: A | Discharge status: Home / Self Care | Payer: Medicaid Other | Source: Ambulatory Visit | Attending: Adult Health | Admitting: Adult Health

## 2020-09-07 VITALS — BP 112/73 | Ht 66.0 in | Wt 112.0 lb

## 2020-09-07 DIAGNOSIS — Z0000 Encounter for general adult medical examination without abnormal findings: Secondary | ICD-10-CM | POA: Diagnosis not present

## 2020-09-07 DIAGNOSIS — Z331 Pregnant state, incidental: Secondary | ICD-10-CM | POA: Insufficient documentation

## 2020-09-07 DIAGNOSIS — Z8619 Personal history of other infectious and parasitic diseases: Secondary | ICD-10-CM | POA: Insufficient documentation

## 2020-09-07 DIAGNOSIS — Z01419 Encounter for gynecological examination (general) (routine) without abnormal findings: Secondary | ICD-10-CM | POA: Diagnosis not present

## 2020-09-07 DIAGNOSIS — Z1389 Encounter for screening for other disorder: Secondary | ICD-10-CM | POA: Diagnosis not present

## 2020-09-07 LAB — POCT URINALYSIS DIPSTICK OB
Blood, UA: NEGATIVE
Glucose, UA: NEGATIVE
Ketones, UA: NEGATIVE
Leukocytes, UA: NEGATIVE
Nitrite, UA: NEGATIVE

## 2020-09-07 NOTE — Progress Notes (Signed)
Patient ID: Elizabeth Larsen, female   DOB: 2001-03-07, 19 y.o.   MRN: [id number] History of Present Illness: Elizabeth Larsen is a 19 year old white female,single, G1P0, just found out she is pregnant last week when had US,about 6+6 weeks today,in for physical and first pap and POC recent chlamydia. PCP is Dr Laddie Aquas    Current Medications, Allergies, Past Medical History, Past Surgical History, Family History and Social History were reviewed in [employer] electronic medical record.     Review of Systems: Patient denies any headaches, hearing loss, fatigue, blurred vision, shortness of breath, chest pain, abdominal pain, problems with bowel movements, urination, or intercourse. No joint pain or mood swings.    Physical Exam:BP 112/73 (BP Location: Left Arm, Patient Position: Sitting, Cuff Size: Normal)    Ht 5\' 6"  (1.676 m)    Wt 112 lb (50.8 kg)    LMP 03/02/2020 (Approximate) Comment: stopped birth control last Wednesday   BMI 18.08 kg/m urine trace protein. General:  Well developed, well nourished, no acute distress Skin:  Warm and dry Neck:  Midline trachea, normal thyroid, good ROM, no lymphadenopathy Lungs; Clear to auscultation bilaterally Breast:  No dominant palpable mass, retraction, or nipple discharge,has bilateral inverted nipples. Cardiovascular: Regular rate and rhythm Abdomen:  Soft, non tender, no hepatosplenomegaly Pelvic:  External genitalia is normal in appearance, no lesions.  The vagina is normal in appearance. Urethra has no lesions or masses. The cervix is nulliparous,pap with GC/CHL performed.Thursday  Uterus is felt to be normal size, shape, and contour.  No adnexal masses or tenderness noted.Bladder is non tender, no masses felt Extremities/musculoskeletal:  No swelling or varicosities noted, no clubbing or cyanosis Psych:  No mood changes, alert and cooperative,seems happy AA is 0 PHQ 9 score is 1  [facility] - 09/07/20 1105      Pregnancy Intention Screening   Does  the patient want to become pregnant in the next year? N/A    Does the patient's partner want to become pregnant in the next year? N/A    Would the patient like to discuss contraceptive options today? N/A      Contraception Wrap Up   Current Method Pregnant/Seeking Pregnancy    End Method Pregnant/Seeking Pregnancy    Contraception Counseling Provided No          Examination chaperoned by 09/09/20 LPN  Impression and Plan: 1. Pregnant state, incidental Continue PNV Has new ON 10/26 -gave handout by [facility] to review   2. Screening for genitourinary condition   3. Routine medical exam Pap sent   4. Encounter for gynecological examination with Papanicolaou smear of cervix Pap sent Pap in 3 if normal Physical in 1 year  5. History of chlamydia GC/CHL on pap sent

## 2020-09-08 LAB — CYTOLOGY - PAP
Chlamydia: NEGATIVE
Comment: NEGATIVE
Comment: NORMAL
Diagnosis: NEGATIVE
Neisseria Gonorrhea: NEGATIVE

## 2020-09-10 DIAGNOSIS — E162 Hypoglycemia, unspecified: Secondary | ICD-10-CM | POA: Diagnosis not present

## 2020-09-14 ENCOUNTER — Encounter: Payer: Medicaid Other | Admitting: Women's Health

## 2020-09-14 ENCOUNTER — Other Ambulatory Visit: Payer: Medicaid Other

## 2020-09-14 ENCOUNTER — Ambulatory Visit: Payer: Medicaid Other | Admitting: *Deleted

## 2020-10-11 DIAGNOSIS — Z3400 Encounter for supervision of normal first pregnancy, unspecified trimester: Secondary | ICD-10-CM | POA: Insufficient documentation

## 2020-10-12 ENCOUNTER — Other Ambulatory Visit: Payer: Self-pay | Admitting: Obstetrics & Gynecology

## 2020-10-12 DIAGNOSIS — Z3682 Encounter for antenatal screening for nuchal translucency: Secondary | ICD-10-CM

## 2020-10-13 ENCOUNTER — Ambulatory Visit (INDEPENDENT_AMBULATORY_CARE_PROVIDER_SITE_OTHER): Payer: Medicaid Other

## 2020-10-13 ENCOUNTER — Encounter: Payer: Self-pay | Admitting: Women's Health

## 2020-10-13 ENCOUNTER — Ambulatory Visit (INDEPENDENT_AMBULATORY_CARE_PROVIDER_SITE_OTHER): Payer: Medicaid Other | Admitting: Women's Health

## 2020-10-13 ENCOUNTER — Other Ambulatory Visit: Payer: Self-pay

## 2020-10-13 VITALS — BP 96/69 | HR 74 | Wt 115.0 lb

## 2020-10-13 DIAGNOSIS — Z1389 Encounter for screening for other disorder: Secondary | ICD-10-CM

## 2020-10-13 DIAGNOSIS — F17200 Nicotine dependence, unspecified, uncomplicated: Secondary | ICD-10-CM

## 2020-10-13 DIAGNOSIS — A749 Chlamydial infection, unspecified: Secondary | ICD-10-CM

## 2020-10-13 DIAGNOSIS — Z3481 Encounter for supervision of other normal pregnancy, first trimester: Secondary | ICD-10-CM | POA: Diagnosis not present

## 2020-10-13 DIAGNOSIS — Z3401 Encounter for supervision of normal first pregnancy, first trimester: Secondary | ICD-10-CM | POA: Diagnosis not present

## 2020-10-13 DIAGNOSIS — Z3682 Encounter for antenatal screening for nuchal translucency: Secondary | ICD-10-CM

## 2020-10-13 DIAGNOSIS — Z3A12 12 weeks gestation of pregnancy: Secondary | ICD-10-CM

## 2020-10-13 DIAGNOSIS — Z3402 Encounter for supervision of normal first pregnancy, second trimester: Secondary | ICD-10-CM

## 2020-10-13 DIAGNOSIS — Z331 Pregnant state, incidental: Secondary | ICD-10-CM

## 2020-10-13 LAB — POCT URINALYSIS DIPSTICK OB
Blood, UA: NEGATIVE
Glucose, UA: NEGATIVE
Ketones, UA: NEGATIVE
Leukocytes, UA: NEGATIVE
Nitrite, UA: NEGATIVE
POC,PROTEIN,UA: NEGATIVE

## 2020-10-13 NOTE — Progress Notes (Signed)
   NURSE VISIT- [facility]  SUBJECTIVE:  Elizabeth Larsen is a 19 y.o. G58P0000 female here for Panorama NIPT and [facility] Carrier Screening . She is [redacted]w[redacted]d pregnant.   OBJECTIVE:  Appears well, in no apparent distress  Blood work drawn from right [facility] without difficulty. 1 attempt(s).   ASSESSMENT: Pregnancy [redacted]w[redacted]d Panorama NIPT and Horizon Carrier Screening  PLAN: [facility] portal information given and instructed patient how to access results   Jobe Marker  10/13/2020 11:29 AM

## 2020-10-13 NOTE — Patient Instructions (Signed)
Elizabeth Larsen, I greatly value your feedback.  If you receive a survey following your visit with [place] today, we appreciate you taking the time to fill it out.  Thanks, [name], CNM, WHNP-BC   [facility] ([address] [place], [place] [address]) Entrance C, located off of [address] Free 24/7 valet parking   Nausea & Vomiting  Have saltine crackers or pretzels by your bed and eat a few bites before you raise your head out of bed in the morning  Eat small frequent meals throughout the day instead of large meals  Drink plenty of fluids throughout the day to stay hydrated, just don't drink a lot of fluids with your meals.  This can make your stomach fill up faster making you feel sick  Do not brush your teeth right after you eat  Products with real ginger are good for nausea, like ginger ale and ginger hard candy Make sure it says made with real ginger!  Sucking on sour candy like lemon heads is also good for nausea  If your prenatal vitamins make you nauseated, take them at night so you will sleep through the nausea  Sea Bands  If you feel like you need medicine for the nausea & vomiting please let us know  If you are unable to keep any fluids or food down please let us know   Constipation  Drink plenty of fluid, preferably water, throughout the day  Eat foods high in fiber such as fruits, vegetables, and grains  Exercise, such as walking, is a good way to keep your bowels regular  Drink warm fluids, especially warm prune juice, or decaf coffee  Eat a 1/2 cup of real oatmeal (not instant), 1/2 cup applesauce, and 1/2-1 cup warm prune juice every day  If needed, you may take Colace (docusate sodium) stool softener once or twice a day to help keep the stool soft.   If you still are having problems with constipation, you may take Miralax once daily as needed to help keep your bowels regular.   Home Blood Pressure Monitoring for Patients    Your provider has recommended that you check your blood pressure (BP) at least once a week at home. If you do not have a blood pressure cuff at home, one will be provided for you. Contact your provider if you have not received your monitor within 1 week.   Helpful Tips for Accurate Home Blood Pressure Checks  . Don't smoke, exercise, or drink caffeine 30 minutes before checking your BP . Use the restroom before checking your BP (a full bladder can raise your pressure) . Relax in a comfortable upright chair . Feet on the ground . Left arm resting comfortably on a flat surface at the level of your heart . Legs uncrossed . Back supported . Sit quietly and don't talk . Place the cuff on your bare arm . Adjust snuggly, so that only two fingertips can fit between your skin and the top of the cuff . Check 2 readings separated by at least one minute . Keep a log of your BP readings . For a visual, please reference this diagram: http://ccnc.care/bpdiagram  Provider Name: [facility]     Phone: [phone]  Zone 1: ALL CLEAR  Continue to monitor your symptoms:  . BP reading is less than 140 (top number) or less than 90 (bottom number)  . No right upper stomach pain . No headaches or  seeing spots . No feeling nauseated or throwing up . No swelling in face and hands  Zone 2: CAUTION Call your doctor's office for any of the following:  . BP reading is greater than 140 (top number) or greater than 90 (bottom number)  . Stomach pain under your ribs in the middle or right side . Headaches or seeing spots . Feeling nauseated or throwing up . Swelling in face and hands  Zone 3: EMERGENCY  Seek immediate medical care if you have any of the following:  . BP reading is greater than160 (top number) or greater than 110 (bottom number) . Severe headaches not improving with Tylenol . Serious difficulty catching your breath . Any worsening symptoms from Zone 2    First Trimester of  Pregnancy The first trimester of pregnancy is from week 1 until the end of week 12 (months 1 through 3). A week after a sperm fertilizes an egg, the egg will implant on the wall of the uterus. This embryo will begin to develop into a baby. Genes from you and your partner are forming the baby. The female genes determine whether the baby is a boy or a girl. At 6-8 weeks, the eyes and face are formed, and the heartbeat can be seen on ultrasound. At the end of 12 weeks, all the baby's organs are formed.  Now that you are pregnant, you will want to do everything you can to have a healthy baby. Two of the most important things are to get good prenatal care and to follow your health care provider's instructions. Prenatal care is all the medical care you receive before the baby's birth. This care will help prevent, find, and treat any problems during the pregnancy and childbirth. BODY CHANGES Your body goes through many changes during pregnancy. The changes vary from woman to woman.   You may gain or lose a couple of pounds at first.  You may feel sick to your stomach (nauseous) and throw up (vomit). If the vomiting is uncontrollable, call your health care provider.  You may tire easily.  You may develop headaches that can be relieved by medicines approved by your health care provider.  You may urinate more often. Painful urination may mean you have a bladder infection.  You may develop heartburn as a result of your pregnancy.  You may develop constipation because certain hormones are causing the muscles that push waste through your intestines to slow down.  You may develop hemorrhoids or swollen, bulging veins (varicose veins).  Your breasts may begin to grow larger and become tender. Your nipples may stick out more, and the tissue that surrounds them (areola) may become darker.  Your gums may bleed and may be sensitive to brushing and flossing.  Dark spots or blotches (chloasma, mask of pregnancy)  may develop on your face. This will likely fade after the baby is born.  Your menstrual periods will stop.  You may have a loss of appetite.  You may develop cravings for certain kinds of food.  You may have changes in your emotions from day to day, such as being excited to be pregnant or being concerned that something may go wrong with the pregnancy and baby.  You may have more vivid and strange dreams.  You may have changes in your hair. These can include thickening of your hair, rapid growth, and changes in texture. Some women also have hair loss during or after pregnancy, or hair that feels dry or thin. Your  hair will most likely return to normal after your baby is born. WHAT TO EXPECT AT YOUR PRENATAL VISITS During a routine prenatal visit:  You will be weighed to make sure you and the baby are growing normally.  Your blood pressure will be taken.  Your abdomen will be measured to track your baby's growth.  The fetal heartbeat will be listened to starting around week 10 or 12 of your pregnancy.  Test results from any previous visits will be discussed. Your health care provider may ask you:  How you are feeling.  If you are feeling the baby move.  If you have had any abnormal symptoms, such as leaking fluid, bleeding, severe headaches, or abdominal cramping.  If you have any questions. Other tests that may be performed during your first trimester include:  Blood tests to find your blood type and to check for the presence of any previous infections. They will also be used to check for low iron levels (anemia) and Rh antibodies. Later in the pregnancy, blood tests for diabetes will be done along with other tests if problems develop.  Urine tests to check for infections, diabetes, or protein in the urine.  An ultrasound to confirm the proper growth and development of the baby.  An amniocentesis to check for possible genetic problems.  Fetal screens for spina bifida and  Down syndrome.  You may need other tests to make sure you and the baby are doing well. HOME CARE INSTRUCTIONS  Medicines  Follow your health care provider's instructions regarding medicine use. Specific medicines may be either safe or unsafe to take during pregnancy.  Take your prenatal vitamins as directed.  If you develop constipation, try taking a stool softener if your health care provider approves. Diet  Eat regular, well-balanced meals. Choose a variety of foods, such as meat or vegetable-based protein, fish, milk and low-fat dairy products, vegetables, fruits, and whole grain breads and cereals. Your health care provider will help you determine the amount of weight gain that is right for you.  Avoid raw meat and uncooked cheese. These carry germs that can cause birth defects in the baby.  Eating four or five small meals rather than three large meals a day may help relieve nausea and vomiting. If you start to feel nauseous, eating a few soda crackers can be helpful. Drinking liquids between meals instead of during meals also seems to help nausea and vomiting.  If you develop constipation, eat more high-fiber foods, such as fresh vegetables or fruit and whole grains. Drink enough fluids to keep your urine clear or pale yellow. Activity and Exercise  Exercise only as directed by your health care provider. Exercising will help you:  Control your weight.  Stay in shape.  Be prepared for labor and delivery.  Experiencing pain or cramping in the lower abdomen or low back is a good sign that you should stop exercising. Check with your health care provider before continuing normal exercises.  Try to avoid standing for long periods of time. Move your legs often if you must stand in one place for a long time.  Avoid heavy lifting.  Wear low-heeled shoes, and practice good posture.  You may continue to have sex unless your health care provider directs you otherwise. Relief of Pain  or Discomfort  Wear a good support bra for breast tenderness.    Take warm sitz baths to soothe any pain or discomfort caused by hemorrhoids. Use hemorrhoid cream if your health care provider  approves.    Rest with your legs elevated if you have leg cramps or low back pain.  If you develop varicose veins in your legs, wear support hose. Elevate your feet for 15 minutes, 3-4 times a day. Limit salt in your diet. Prenatal Care  Schedule your prenatal visits by the twelfth week of pregnancy. They are usually scheduled monthly at first, then more often in the last 2 months before delivery.  Write down your questions. Take them to your prenatal visits.  Keep all your prenatal visits as directed by your health care provider. Safety  Wear your seat belt at all times when driving.  Make a list of emergency phone numbers, including numbers for family, friends, the hospital, and police and fire departments. General Tips  Ask your health care provider for a referral to a local prenatal education class. Begin classes no later than at the beginning of month 6 of your pregnancy.  Ask for help if you have counseling or nutritional needs during pregnancy. Your health care provider can offer advice or refer you to specialists for help with various needs.  Do not use hot tubs, steam rooms, or saunas.  Do not douche or use tampons or scented sanitary pads.  Do not cross your legs for long periods of time.  Avoid cat litter boxes and soil used by cats. These carry germs that can cause birth defects in the baby and possibly loss of the fetus by miscarriage or stillbirth.  Avoid all smoking, herbs, alcohol, and medicines not prescribed by your health care provider. Chemicals in these affect the formation and growth of the baby.  Schedule a dentist appointment. At home, brush your teeth with a soft toothbrush and be gentle when you floss. SEEK MEDICAL CARE IF:   You have dizziness.  You have mild  pelvic cramps, pelvic pressure, or nagging pain in the abdominal area.  You have persistent nausea, vomiting, or diarrhea.  You have a bad smelling vaginal discharge.  You have pain with urination.  You notice increased swelling in your face, hands, legs, or ankles. SEEK IMMEDIATE MEDICAL CARE IF:   You have a fever.  You are leaking fluid from your vagina.  You have spotting or bleeding from your vagina.  You have severe abdominal cramping or pain.  You have rapid weight gain or loss.  You vomit blood or material that looks like coffee grounds.  You are exposed to [place] measles and have never had them.  You are exposed to fifth disease or chickenpox.  You develop a severe headache.  You have shortness of breath.  You have any kind of trauma, such as from a fall or a car accident. Document Released: 10/31/2001 Document Revised: 03/23/2014 Document Reviewed: 09/16/2013 [facility] Patient Information 2015 [place], [place]. This information is not intended to replace advice given to you by your health care provider. Make sure you discuss any questions you have with your health care provider.

## 2020-10-13 NOTE — Progress Notes (Signed)
INITIAL OBSTETRICAL VISIT Patient name: Elizabeth Larsen MRN [id number]  Date of birth: 2001/04/08 Chief Complaint:   Initial Prenatal Visit (nt/it)  History of Present Illness:   Elizabeth Larsen is a 19 y.o. G1P0000 Caucasian female at [redacted]w[redacted]d by [place] at 5 weeks with an Estimated Date of Delivery: 04/27/21 being seen today for her initial obstetrical visit.   Her obstetrical history is significant for primigravida.    Both parents carrier for polycystic kidney disease Vapes- has cut down since +PT Today she reports mild nausea- declines meds.  Depression screen [facility] 2/9 10/13/2020 09/07/2020 07/23/2020 07/06/2020 06/17/2020  Decreased Interest 0 0 0 0 0  Down, Depressed, Hopeless 0 0 0 0 0  PHQ - 2 Score 0 0 0 0 0  Altered sleeping 0 1 0 2 -  Tired, decreased energy 0 0 0 0 -  Change in appetite 0 0 0 0 -  Feeling bad or failure about yourself  0 0 0 0 -  Trouble concentrating 0 0 0 0 -  Moving slowly or fidgety/restless 0 0 0 1 -  Suicidal thoughts 0 0 0 0 -  PHQ-9 Score 0 1 0 3 -  Difficult doing work/chores - - - Not difficult at all -    Patient's last menstrual period was 03/02/2020 (approximate). Last pap 09/07/20. Results were: normal Review of Systems:   Pertinent items are noted in HPI Denies cramping/contractions, leakage of fluid, vaginal bleeding, abnormal vaginal discharge w/ itching/odor/irritation, headaches, visual changes, shortness of breath, chest pain, abdominal pain, severe nausea/vomiting, or problems with urination or bowel movements unless otherwise stated above.  Pertinent History Reviewed:  Reviewed past medical,surgical, social, obstetrical and family history.  Reviewed problem list, medications and allergies. OB History  Gravida Para Term Preterm AB Living  1 0 0 0 0 0  SAB TAB Ectopic Multiple Live Births  0 0 0 0 0    # Outcome Date GA Lbr Len/2nd Weight Sex Delivery Anes PTL Lv  1 Current            Physical Assessment:   Vitals:   10/13/20  1118  BP: 96/69  Pulse: 74  Weight: 115 lb (52.2 kg)  Body mass index is 18.56 kg/m.       Physical Examination:  General appearance - well appearing, and in no distress  Mental status - alert, oriented to person, place, and time  Psych:  She has a normal mood and affect  Skin - warm and dry, normal color, no suspicious lesions noted  Chest - effort normal, all lung fields clear to auscultation bilaterally  Heart - normal rate and regular rhythm  Abdomen - soft, nontender  Extremities:  No swelling or varicosities noted  Thin prep pap is not done  Chaperone: N/A    TODAY'S NT [place] 12 wks,measurements c/w dates,fhr 148 bpm,posterior placenta gr 0,right ovary not visualized,normal left ovary,NT 1.2 mm,NB present   Results for orders placed or performed in visit on 10/13/20 (from the past 24 hour(s))  POC Urinalysis Dipstick OB   Collection Time: 10/13/20 11:40 AM  Result Value Ref Range   Color, UA     Clarity, UA     Glucose, UA Negative Negative   Bilirubin, UA     Ketones, UA neg    Spec Grav, UA     Blood, UA neg    pH, UA     POC,PROTEIN,UA Negative Negative, Trace, Small (1+), Moderate (2+), Large (3+), 4+  Urobilinogen, UA     Nitrite, UA neg    Leukocytes, UA Negative Negative   Appearance     Odor      Assessment & Plan:  1) Low-Risk Pregnancy G1P0000 at [redacted]w[redacted]d with an Estimated Date of Delivery: 04/27/21   2) Initial OB visit  3) Both of pt's parents carrier for polycystic kidney disease  4) Vapes> advised cessation  Meds: No orders of the defined types were placed in this encounter.   Initial labs obtained Continue prenatal vitamins Reviewed n/v relief measures and warning s/s to report Reviewed recommended weight gain based on pre-gravid BMI Encouraged well-balanced diet Genetic & carrier screening discussed: requests Panorama, NT/IT and Horizon 14  Ultrasound discussed; fetal survey: requested CCNC completed> form faxed if has or is planning to apply  for medicaid The nature of [place] [facility] [employer] with multiple MDs and other Advanced Practice Providers was explained to patient; also emphasized that fellows, residents, and students are part of our team. Has home bp cuff. Check bp weekly, let us know if >140/90.   Follow-up: Return in about 3 weeks (around 11/03/2020) for LROB, 2nd IT, in person, CNM.   Orders Placed This Encounter  Procedures   Urine Culture   GC/Chlamydia Probe Amp   Pain Management Screening Profile (10S)   CBC/D/Plt+RPR+Rh+ABO+Rub Ab...   Genetic Screening   Integrated 1   POC Urinalysis Dipstick OB    Cheral Marker CNM, [facility] 10/13/2020 12:36 PM

## 2020-10-13 NOTE — Progress Notes (Signed)
[place] 12 wks,measurements c/w dates,fhr 148 bpm,posterior placenta gr 0,right ovary not visualized,normal left ovary,NT 1.2 mm,NB present

## 2020-10-14 LAB — INTEGRATED 1

## 2020-10-15 LAB — CBC/D/PLT+RPR+RH+ABO+RUB AB...
Antibody Screen: NEGATIVE
Basophils Absolute: 0 10*3/uL (ref 0.0–0.2)
Basos: 0 %
EOS (ABSOLUTE): 0.1 10*3/uL (ref 0.0–0.4)
Eos: 1 %
HCV Ab: <0.1 s/co ratio (ref 0.0–0.9)
HIV Screen 4th Generation wRfx: NONREACTIVE
Hematocrit: 38.6 % (ref 34.0–46.6)
Hemoglobin: 12.5 g/dL (ref 11.1–15.9)
Hepatitis B Surface Ag: NEGATIVE
Immature Grans (Abs): 0 10*3/uL (ref 0.0–0.1)
Immature Granulocytes: 0 %
Lymphocytes Absolute: 1.5 10*3/uL (ref 0.7–3.1)
Lymphs: 21 %
MCH: 29.4 pg (ref 26.6–33.0)
MCHC: 32.4 g/dL (ref 31.5–35.7)
MCV: 91 fL (ref 79–97)
Monocytes Absolute: 0.5 10*3/uL (ref 0.1–0.9)
Monocytes: 7 %
Neutrophils Absolute: 5.3 10*3/uL (ref 1.4–7.0)
Neutrophils: 71 %
Platelets: 268 10*3/uL (ref 150–450)
RBC: 4.25 x10E6/uL (ref 3.77–5.28)
RDW: 13 % (ref 11.7–15.4)
RPR Ser Ql: NONREACTIVE
Rh Factor: POSITIVE
Rubella Antibodies, IGG: 16.3 index (ref >0.99)
WBC: 7.5 10*3/uL (ref 3.4–10.8)

## 2020-10-15 LAB — INTEGRATED 1
Crown Rump Length: 62 mm
Gest. Age on Collection Date: 12.4 weeks
Maternal Age at EDD: 21.7 yr
Nuchal Translucency (NT): 1.2 mm
Number of Fetuses: 1
PAPP-A Value: 887.2 ng/mL
Weight: 115 [lb_av]

## 2020-10-15 LAB — GC/CHLAMYDIA PROBE AMP
Chlamydia trachomatis, NAA: NEGATIVE
Neisseria Gonorrhoeae by PCR: NEGATIVE

## 2020-10-15 LAB — HCV INTERPRETATION

## 2020-10-15 LAB — URINE CULTURE

## 2020-10-19 ENCOUNTER — Telehealth: Payer: Self-pay | Admitting: Obstetrics & Gynecology

## 2020-10-19 ENCOUNTER — Telehealth: Payer: Self-pay | Admitting: *Deleted

## 2020-10-19 NOTE — Telephone Encounter (Signed)
Patient informed genetic results are still in process and may be delayed due to the holiday. Pt verbalized understanding.

## 2020-10-19 NOTE — Telephone Encounter (Signed)
Pt states she read some reviews online about the genetic testing & gender info & the reviews state that the provider office will get the results way before it's posted on the website  Pt wonders if the results are here or if we can access them before she can  Please advise & notify pt

## 2020-10-20 LAB — PMP SCREEN PROFILE (10S), URINE
Amphetamine Scrn, Ur: NEGATIVE ng/mL
BARBITURATE SCREEN URINE: NEGATIVE ng/mL
BENZODIAZEPINE SCREEN, URINE: NEGATIVE ng/mL
CANNABINOIDS UR QL SCN: NEGATIVE ng/mL
Cocaine (Metab) Scrn, Ur: NEGATIVE ng/mL
Creatinine(Crt), U: 105.4 mg/dL (ref 20.0–300.0)
Methadone Screen, Urine: NEGATIVE ng/mL
OXYCODONE+OXYMORPHONE UR QL SCN: NEGATIVE ng/mL
Opiate Scrn, Ur: NEGATIVE ng/mL
Ph of Urine: 6.1 (ref 4.5–8.9)
Phencyclidine Qn, Ur: NEGATIVE ng/mL
Propoxyphene Scrn, Ur: NEGATIVE ng/mL

## 2020-10-28 ENCOUNTER — Telehealth: Payer: Self-pay | Admitting: Women's Health

## 2020-10-28 NOTE — Telephone Encounter (Signed)
Pt called because she had a low sugar episode today, is [redacted] weeks pregnant, states around lunch she felt weird Checked her blood sugar, was 57, ate a Snickers & Pepsi, checked again at 127 Last seen by her endocrinologist in September  Please advise

## 2020-10-29 NOTE — Telephone Encounter (Signed)
Left message letting pt know to eat small frequent meals/snacks with protein every couple of hours. JSY

## 2020-11-03 ENCOUNTER — Ambulatory Visit (INDEPENDENT_AMBULATORY_CARE_PROVIDER_SITE_OTHER): Payer: Medicaid Other | Admitting: Advanced Practice Midwife

## 2020-11-03 ENCOUNTER — Other Ambulatory Visit: Payer: Self-pay

## 2020-11-03 VITALS — BP 113/73 | HR 75 | Wt 120.8 lb

## 2020-11-03 DIAGNOSIS — Z331 Pregnant state, incidental: Secondary | ICD-10-CM

## 2020-11-03 DIAGNOSIS — Z1379 Encounter for other screening for genetic and chromosomal anomalies: Secondary | ICD-10-CM

## 2020-11-03 DIAGNOSIS — Z1389 Encounter for screening for other disorder: Secondary | ICD-10-CM

## 2020-11-03 DIAGNOSIS — Z363 Encounter for antenatal screening for malformations: Secondary | ICD-10-CM

## 2020-11-03 DIAGNOSIS — Z3402 Encounter for supervision of normal first pregnancy, second trimester: Secondary | ICD-10-CM

## 2020-11-03 DIAGNOSIS — Z3A15 15 weeks gestation of pregnancy: Secondary | ICD-10-CM | POA: Diagnosis not present

## 2020-11-03 DIAGNOSIS — F17200 Nicotine dependence, unspecified, uncomplicated: Secondary | ICD-10-CM

## 2020-11-03 LAB — POCT URINALYSIS DIPSTICK OB
Blood, UA: NEGATIVE
Glucose, UA: NEGATIVE
Ketones, UA: NEGATIVE
Leukocytes, UA: NEGATIVE
Nitrite, UA: NEGATIVE
POC,PROTEIN,UA: NEGATIVE

## 2020-11-03 NOTE — Progress Notes (Signed)
   LOW-RISK PREGNANCY VISIT Patient name: Elizabeth Larsen MRN [id number]  Date of birth: 09/22/2001 Chief Complaint:   Routine Prenatal Visit  History of Present Illness:   AMARYLLIS MALMQUIST is a 19 y.o. G28P0000 female at [redacted]w[redacted]d with an Estimated Date of Delivery: 04/27/21 being seen today for ongoing management of a low-risk pregnancy.  Today she reports feeling dizzy and having a blood sugar of 57 last week that came up with eating; stopped vaping!. Contractions: Not present. Vag. Bleeding: None.  Movement: Present. denies leaking of fluid. Review of Systems:   Pertinent items are noted in HPI Denies abnormal vaginal discharge w/ itching/odor/irritation, headaches, visual changes, shortness of breath, chest pain, abdominal pain, severe nausea/vomiting, or problems with urination or bowel movements unless otherwise stated above. Pertinent History Reviewed:  Reviewed past medical,surgical, social, obstetrical and family history.  Reviewed problem list, medications and allergies. Physical Assessment:   Vitals:   11/03/20 1023  BP: 113/73  Pulse: 75  Weight: 120 lb 12.8 oz (54.8 kg)  Body mass index is 19.5 kg/m.        Physical Examination:   General appearance: Well appearing, and in no distress  Mental status: Alert, oriented to person, place, and time  Skin: Warm & dry  Cardiovascular: Normal heart rate noted  Respiratory: Normal respiratory effort, no distress  Abdomen: Soft, gravid, nontender  Pelvic: Cervical exam deferred         Extremities: Edema: None  Fetal Status: Fetal Heart Rate (bpm): 148   Movement: Present    Results for orders placed or performed in visit on 11/03/20 (from the past 24 hour(s))  POC Urinalysis Dipstick OB   Collection Time: 11/03/20 10:21 AM  Result Value Ref Range   Color, UA     Clarity, UA     Glucose, UA Negative Negative   Bilirubin, UA     Ketones, UA neg    Spec Grav, UA     Blood, UA neg    pH, UA     POC,PROTEIN,UA Negative  Negative, Trace, Small (1+), Moderate (2+), Large (3+), 4+   Urobilinogen, UA     Nitrite, UA neg    Leukocytes, UA Negative Negative   Appearance     Odor      Assessment & Plan:  1) Low-risk pregnancy G1P0000 at [redacted]w[redacted]d with an Estimated Date of Delivery: 04/27/21   2) Hypoglycemia episodes, reviewed eating small amounts of protein at each meal and keeping hydrated to see if this helps  3) Previous smoker (vape), stopped   Meds: No orders of the defined types were placed in this encounter.  Labs/procedures today: 2nd IT  Plan:  Continue routine obstetrical care   Reviewed: Preterm labor symptoms and general obstetric precautions including but not limited to vaginal bleeding, contractions, leaking of fluid and fetal movement were reviewed in detail with the patient.  All questions were answered. Has home bp cuff. Check bp weekly, let us know if >140/90.   Follow-up: Return in about 4 weeks (around 12/01/2020) for LROB, [place]: Anatomy, in person.  Orders Placed This Encounter  Procedures  . US OB Comp + 14 Wk  . INTEGRATED 2  . POC Urinalysis Dipstick OB   [name] 11/03/2020 10:45 AM

## 2020-11-03 NOTE — Patient Instructions (Signed)
Elizabeth Larsen, I greatly value your feedback.  If you receive a survey following your visit with [place] today, we appreciate you taking the time to fill it out.  Thanks, Elizabeth Larsen, CNM  [facility] ([address] [place], [place] [address]) Entrance C, located off of [address] valet parking  Go to [email or web address] to register for FREE online childbirth classes  [place] Pediatricians/Family Doctors:  [facility] [phone]            [facility] [phone]                 [facility] [phone] (usually not accepting new patients unless you have family there already, you are always welcome to call and ask)       [facility] Department [phone]       [facility] Pediatricians/Family Doctors:   [facility]: [phone]  [facility]: [phone]  [facility]: [phone]  [facility] Doctors:   [facility]: [phone]   [facility]: [phone]  [facility] Doctors:  [facility]: [phone]    Home Blood Pressure Monitoring for Patients   Your provider has recommended that you check your blood pressure (BP) at least once a week at home. If you do not have a blood pressure cuff at home, one will be provided for you. Contact your provider if you have not received your monitor within 1 week.   Helpful Tips for Accurate Home Blood Pressure Checks  . Don't smoke, exercise, or drink caffeine 30 minutes before checking your BP . Use the restroom before checking your BP (a full bladder can raise your pressure) . Relax in a comfortable upright chair . Feet on the ground . Left arm resting comfortably on a flat surface at the level of your heart . Legs uncrossed . Back supported . Sit quietly and don't talk . Place the cuff on your bare arm . Adjust snuggly, so that  only two fingertips can fit between your skin and the top of the cuff . Check 2 readings separated by at least one minute . Keep a log of your BP readings . For a visual, please reference this diagram: http://ccnc.care/bpdiagram  Provider Name: [facility]     Phone: [phone]  Zone 1: ALL CLEAR  Continue to monitor your symptoms:  . BP reading is less than 140 (top number) or less than 90 (bottom number)  . No right upper stomach pain . No headaches or seeing spots . No feeling nauseated or throwing up . No swelling in face and hands  Zone 2: CAUTION Call your doctor's office for any of the following:  . BP reading is greater than 140 (top number) or greater than 90 (bottom number)  . Stomach pain under your ribs in the middle or right side . Headaches or seeing spots . Feeling nauseated or throwing up . Swelling in face and hands  Zone 3: EMERGENCY  Seek immediate medical care if you have any of the following:  . BP reading is greater than160 (top number) or greater than 110 (bottom number) . Severe headaches not improving with Tylenol . Serious difficulty catching your breath . Any worsening symptoms from Zone 2     Second Trimester of Pregnancy The second trimester is from week 14 through week 27 (months 4 through 6). The second trimester is often a time when you feel your best.  Your body has adjusted to being pregnant, and you begin to feel better physically. Usually, morning sickness has lessened or quit completely, you may have more energy, and you may have an increase in appetite. The second trimester is also a time when the fetus is growing rapidly. At the end of the sixth month, the fetus is about 9 inches long and weighs about 1 pounds. You will likely begin to feel the baby move (quickening) between 16 and 20 weeks of pregnancy. Body changes during your second trimester Your body continues to go through many changes during your second trimester. The changes  vary from woman to woman.  Your weight will continue to increase. You will notice your lower abdomen bulging out.  You may begin to get stretch marks on your hips, abdomen, and breasts.  You may develop headaches that can be relieved by medicines. The medicines should be approved by your health care provider.  You may urinate more often because the fetus is pressing on your bladder.  You may develop or continue to have heartburn as a result of your pregnancy.  You may develop constipation because certain hormones are causing the muscles that push waste through your intestines to slow down.  You may develop hemorrhoids or swollen, bulging veins (varicose veins).  You may have back pain. This is caused by: ? Weight gain. ? Pregnancy hormones that are relaxing the joints in your pelvis. ? A shift in weight and the muscles that support your balance.  Your breasts will continue to grow and they will continue to become tender.  Your gums may bleed and may be sensitive to brushing and flossing.  Dark spots or blotches (chloasma, mask of pregnancy) may develop on your face. This will likely fade after the baby is born.  A dark line from your belly button to the pubic area (linea nigra) may appear. This will likely fade after the baby is born.  You may have changes in your hair. These can include thickening of your hair, rapid growth, and changes in texture. Some women also have hair loss during or after pregnancy, or hair that feels dry or thin. Your hair will most likely return to normal after your baby is born.  What to expect at prenatal visits During a routine prenatal visit:  You will be weighed to make sure you and the fetus are growing normally.  Your blood pressure will be taken.  Your abdomen will be measured to track your baby's growth.  The fetal heartbeat will be listened to.  Any test results from the previous visit will be discussed.  Your health care provider may  ask you:  How you are feeling.  If you are feeling the baby move.  If you have had any abnormal symptoms, such as leaking fluid, bleeding, severe headaches, or abdominal cramping.  If you are using any tobacco products, including cigarettes, chewing tobacco, and electronic cigarettes.  If you have any questions.  Other tests that may be performed during your second trimester include:  Blood tests that check for: ? Low iron levels (anemia). ? High blood sugar that affects pregnant women (gestational diabetes) between 73 and 28 weeks. ? Rh antibodies. This is to check for a protein on red blood cells (Rh factor).  Urine tests to check for infections, diabetes, or protein in the urine.  An ultrasound to confirm the proper growth and development of the baby.  An amniocentesis to check for possible genetic problems.  Fetal screens  for spina bifida and Down syndrome.  HIV (human immunodeficiency virus) testing. Routine prenatal testing includes screening for HIV, unless you choose not to have this test.  Follow these instructions at home: Medicines  Follow your health care provider's instructions regarding medicine use. Specific medicines may be either safe or unsafe to take during pregnancy.  Take a prenatal vitamin that contains at least 600 micrograms (mcg) of folic acid.  If you develop constipation, try taking a stool softener if your health care provider approves. Eating and drinking  Eat a balanced diet that includes fresh fruits and vegetables, whole grains, good sources of protein such as meat, eggs, or tofu, and low-fat dairy. Your health care provider will help you determine the amount of weight gain that is right for you.  Avoid raw meat and uncooked cheese. These carry germs that can cause birth defects in the baby.  If you have low calcium intake from food, talk to your health care provider about whether you should take a daily calcium supplement.  Limit foods  that are high in fat and processed sugars, such as fried and sweet foods.  To prevent constipation: ? Drink enough fluid to keep your urine clear or pale yellow. ? Eat foods that are high in fiber, such as fresh fruits and vegetables, whole grains, and beans. Activity  Exercise only as directed by your health care provider. Most women can continue their usual exercise routine during pregnancy. Try to exercise for 30 minutes at least 5 days a week. Stop exercising if you experience uterine contractions.  Avoid heavy lifting, wear low heel shoes, and practice good posture.  A sexual relationship may be continued unless your health care provider directs you otherwise. Relieving pain and discomfort  Wear a good support bra to prevent discomfort from breast tenderness.  Take warm sitz baths to soothe any pain or discomfort caused by hemorrhoids. Use hemorrhoid cream if your health care provider approves.  Rest with your legs elevated if you have leg cramps or low back pain.  If you develop varicose veins, wear support hose. Elevate your feet for 15 minutes, 3-4 times a day. Limit salt in your diet. Prenatal Care  Write down your questions. Take them to your prenatal visits.  Keep all your prenatal visits as told by your health care provider. This is important. Safety  Wear your seat belt at all times when driving.  Make a list of emergency phone numbers, including numbers for family, friends, the hospital, and police and fire departments. General instructions  Ask your health care provider for a referral to a local prenatal education class. Begin classes no later than the beginning of month 6 of your pregnancy.  Ask for help if you have counseling or nutritional needs during pregnancy. Your health care provider can offer advice or refer you to specialists for help with various needs.  Do not use hot tubs, steam rooms, or saunas.  Do not douche or use tampons or scented sanitary  pads.  Do not cross your legs for long periods of time.  Avoid cat litter boxes and soil used by cats. These carry germs that can cause birth defects in the baby and possibly loss of the fetus by miscarriage or stillbirth.  Avoid all smoking, herbs, alcohol, and unprescribed drugs. Chemicals in these products can affect the formation and growth of the baby.  Do not use any products that contain nicotine or tobacco, such as cigarettes and e-cigarettes. If you need help quitting,  ask your health care provider.  Visit your dentist if you have not gone yet during your pregnancy. Use a soft toothbrush to brush your teeth and be gentle when you floss. Contact a health care provider if:  You have dizziness.  You have mild pelvic cramps, pelvic pressure, or nagging pain in the abdominal area.  You have persistent nausea, vomiting, or diarrhea.  You have a bad smelling vaginal discharge.  You have pain when you urinate. Get help right away if:  You have a fever.  You are leaking fluid from your vagina.  You have spotting or bleeding from your vagina.  You have severe abdominal cramping or pain.  You have rapid weight gain or weight loss.  You have shortness of breath with chest pain.  You notice sudden or extreme swelling of your face, hands, ankles, feet, or legs.  You have not felt your baby move in over an hour.  You have severe headaches that do not go away when you take medicine.  You have vision changes. Summary  The second trimester is from week 14 through week 27 (months 4 through 6). It is also a time when the fetus is growing rapidly.  Your body goes through many changes during pregnancy. The changes vary from woman to woman.  Avoid all smoking, herbs, alcohol, and unprescribed drugs. These chemicals affect the formation and growth your baby.  Do not use any tobacco products, such as cigarettes, chewing tobacco, and e-cigarettes. If you need help quitting, ask your  health care provider.  Contact your health care provider if you have any questions. Keep all prenatal visits as told by your health care provider. This is important. This information is not intended to replace advice given to you by your health care provider. Make sure you discuss any questions you have with your health care provider. Document Released: 10/31/2001 Document Revised: 04/13/2016 Document Reviewed: 01/07/2013 Elsevier Interactive Patient Education  2017 [employer].

## 2020-11-05 LAB — INTEGRATED 2
AFP MoM: 0.71
Alpha-Fetoprotein: 23.9 ng/mL
Crown Rump Length: 62 mm
DIA MoM: 0.72
DIA Value: 137 pg/mL
Estriol, Unconjugated: 0.85 ng/mL
Gest. Age on Collection Date: 12.4 weeks
Gestational Age: 15.4 weeks
Maternal Age at EDD: 21.7 yr
Nuchal Translucency (NT): 1.2 mm
Nuchal Translucency MoM: 0.87
Number of Fetuses: 1
PAPP-A MoM: 0.67
PAPP-A Value: 887.2 ng/mL
Test Results:: NEGATIVE
Weight: 115 [lb_av]
Weight: 120 [lb_av]
hCG MoM: 1.13
hCG Value: 54.9 IU/mL
uE3 MoM: 0.98

## 2020-11-08 DIAGNOSIS — Z3490 Encounter for supervision of normal pregnancy, unspecified, unspecified trimester: Secondary | ICD-10-CM | POA: Diagnosis not present

## 2020-11-10 ENCOUNTER — Telehealth: Payer: Self-pay

## 2020-11-10 NOTE — Telephone Encounter (Signed)
Pain shooting down leg on lateral side when pt laid down, she stated it lasted about an hour and only happened once and wants to speak with someone about the pain she experienced.

## 2020-11-10 NOTE — Telephone Encounter (Signed)
Patient states she is having sharp pain in the sides of her lower abdomen that is sharp and intermittent.  She noticed it earlier when she was laying on her side and when she turned over.  Informed patient this sounded most like round ligament pain which is very normal during pregnancy and is felt mainly with sudden movements such as coughing, laughing and  changing positions.  Encouraged to move slowly if possible but if pain started to have a pattern or she developed bleeding, she should be seen.  Pt verbalized understanding.

## 2020-11-17 ENCOUNTER — Encounter: Payer: Self-pay | Admitting: *Deleted

## 2020-11-17 ENCOUNTER — Telehealth: Payer: Self-pay | Admitting: Women's Health

## 2020-11-17 DIAGNOSIS — Z3402 Encounter for supervision of normal first pregnancy, second trimester: Secondary | ICD-10-CM

## 2020-11-17 NOTE — Telephone Encounter (Signed)
Mychart message sent with list of pregnancy safe meds.

## 2020-11-17 NOTE — Telephone Encounter (Signed)
Patient called and stated that she tested positive for covid and wanted to know if she needs any antibotics. Clinical staff will follow up with patient.

## 2020-11-20 NOTE — L&D Delivery Note (Signed)
Delivery Note:   Desma Wilkowski Empson G1P0000 at 102w5d  Admitting diagnosis: Leakage, amniotic fluid [O42.90] Risks: Preterm uterine contractions- rx'd procardia Bleeding/friable cx w/ Monsels application @ 32wks  First Stage:  Induction of labor: N/A Onset of labor: 5/29 @ 1500 Augmentation: Pitocin ROM: SROM @ 1500 clear fluid  Active labor onset: 5/29 @ 2345 Analgesia /Anesthesia/Pain control intrapartum:  Epidural   Second Stage:  Complete dilation at   0550 Onset of pushing at 0556 FHR second stage Category II. Moderate variability with variables during pushing with good return to baseline.    With good maternal efforts, Pt Pushing in lithotomy position with SNM, CNM and L&D staff support encouraging mom at bedside. FOB "Gardiner Barefoot" and maternal Grandmother present for birth and supportive. Nuchal Cord: No  Delivery of a Live born female  Birth Weight:  **Pending  APGAR: 7/9,   Newborn Delivery   Birth date/time: 04/18/2023 06:57:00 Delivery type: Vaginal, Spontaneous     Infant delivered in cephalic presentation, in LOA position and restituted to LOT position. Fetal head delivered by SNM. With maternal pushing efforts, anterior shoulder released and remaining fetal body delivered with ease. Infant placed immediately S2S with mom. Tone, color, and HR good. Infant dried and stimulated for 1 full minute. Tone FHR, color, and reflexes all good, infant just would not cry. At 1 minute and 30 seconds with drying and stimulating, infant began to cry vigorously.   Cord double clamped after cessation of pulsation by SNM, cut by FOB.  Collection of cord blood for typing completed. Cord blood donation-None  Arterial cord blood sample-  N/A   Third Stage:  Placenta delivered spontaneously intact-  with 3 vessels . Trailing membranes identified and grasped with ring clamps and combined with LUS massage membranes expelled spnotaneously.   Uterine tone firm bleeding minimal without  clots  Uterotonics: IV pitocin  Placenta to L&D for disposal.   No laceration identified.  Episiotomy: N/A Local analgesia: none   Repair:N/A Est. Blood Loss (mL):125.00   Complications: None   Mom to postpartum.  Baby girl Synetta Shadow to Couplet care / Skin to Skin.  Delivery Report:  Review the Delivery Report for details.     Signed: Royetta Asal), BSN, RNC-OB, Student Nurse Midwife 04/18/2021, 7:15 AM

## 2020-12-01 ENCOUNTER — Other Ambulatory Visit: Payer: Self-pay

## 2020-12-01 ENCOUNTER — Ambulatory Visit (INDEPENDENT_AMBULATORY_CARE_PROVIDER_SITE_OTHER): Payer: Medicaid Other | Admitting: Advanced Practice Midwife

## 2020-12-01 ENCOUNTER — Ambulatory Visit (INDEPENDENT_AMBULATORY_CARE_PROVIDER_SITE_OTHER): Payer: Medicaid Other

## 2020-12-01 VITALS — BP 98/65 | HR 68 | Wt 127.0 lb

## 2020-12-01 DIAGNOSIS — Z363 Encounter for antenatal screening for malformations: Secondary | ICD-10-CM

## 2020-12-01 DIAGNOSIS — Z331 Pregnant state, incidental: Secondary | ICD-10-CM

## 2020-12-01 DIAGNOSIS — Z3402 Encounter for supervision of normal first pregnancy, second trimester: Secondary | ICD-10-CM

## 2020-12-01 DIAGNOSIS — Z1389 Encounter for screening for other disorder: Secondary | ICD-10-CM

## 2020-12-01 DIAGNOSIS — Z3A19 19 weeks gestation of pregnancy: Secondary | ICD-10-CM

## 2020-12-01 LAB — POCT URINALYSIS DIPSTICK OB
Blood, UA: NEGATIVE
Glucose, UA: NEGATIVE
Ketones, UA: NEGATIVE
Leukocytes, UA: NEGATIVE
Nitrite, UA: NEGATIVE
POC,PROTEIN,UA: NEGATIVE

## 2020-12-01 NOTE — Progress Notes (Signed)
[place] 19 wks,cephalic,cx 3.3 cm,posterior placenta gr 0,normal ovaries,fhr 148 bpm,svp of fluid 4.2 cm,efw 302 g 79%,anatomy complete,no obvious abnormalities

## 2020-12-01 NOTE — Patient Instructions (Signed)

## 2020-12-01 NOTE — Progress Notes (Signed)
   PRENATAL VISIT NOTE  Subjective:  Elizabeth Larsen is a 20 y.o. G1P0000 at [redacted]w[redacted]d being seen today for ongoing prenatal care.  She is currently monitored for the following issues for this low-risk pregnancy and has Episode of shaking; Postprandial hypoglycemia; Anxiety; Lymphadenopathy; Postcoital bleeding; History of chlamydia; Supervision of normal first pregnancy; and Smoker on their problem list.  Patient reports no complaints.  Contractions: Not present. Vag. Bleeding: None.  Movement: Present. Denies leaking of fluid.   The following portions of the patient's history were reviewed and updated as appropriate: allergies, current medications, past family history, past medical history, past social history, past surgical history and problem list. Problem list updated.  Objective:   Vitals:   12/01/20 1557  BP: 98/65  Pulse: 68  Weight: 127 lb (57.6 kg)    Fetal Status: Fetal Heart Rate (bpm): 140   Movement: Present     General:  Alert, oriented and cooperative. Patient is in no acute distress.  Skin: Skin is warm and dry. No rash noted.   Cardiovascular: Normal heart rate noted  Respiratory: Normal respiratory effort, no problems with respiration noted  Abdomen: Soft, gravid, appropriate for gestational age.  Pain/Pressure: Present     Pelvic: Cervical exam deferred        Extremities: Normal range of motion.  Edema: None  Mental Status: Normal mood and affect. Normal behavior. Normal judgment and thought content.   Assessment and Plan:  Pregnancy: G1P0000 at [redacted]w[redacted]d  1. Encounter for supervision of normal first pregnancy in second trimester - LOB - Anatomy scan today, normal findings - Reviewed physiologic changes in second trimester, schedule of visits  2. Screening for genitourinary condition - POC Urinalysis Dipstick OB   Preterm labor symptoms and general obstetric precautions including but not limited to vaginal bleeding, contractions, leaking of fluid and fetal  movement were reviewed in detail with the patient. Please refer to After Visit Summary for other counseling recommendations.  Return in about 4 weeks (around 12/29/2020).  Future Appointments  Date Time Provider Department Center  12/30/2020  2:30 PM Marzella Schlein, [place], CNM [facility]    Calvert Cantor, CNM

## 2020-12-06 DIAGNOSIS — Z3A00 Weeks of gestation of pregnancy not specified: Secondary | ICD-10-CM | POA: Diagnosis not present

## 2020-12-06 DIAGNOSIS — O4690 Antepartum hemorrhage, unspecified, unspecified trimester: Secondary | ICD-10-CM | POA: Diagnosis not present

## 2020-12-07 ENCOUNTER — Telehealth: Payer: Self-pay

## 2020-12-07 NOTE — Telephone Encounter (Signed)
Patient called to say she was spotting the other day. She had went to the hospital and was checked out and every thing checked out. Was told to refrain from intercourse until she was given the ok from her provider. Was told to call and just to let us know

## 2020-12-08 ENCOUNTER — Telehealth: Payer: Self-pay | Admitting: Women's Health

## 2020-12-08 NOTE — Telephone Encounter (Signed)
Please see below, pt never heard from [place] yesterday      [place], Jordan Hawks, CMA routed this conversation to Hurshel Keys, RN     [place], Jordan Hawks, CMA     12/07/20 1:59 PM Note Patient called to say she was spotting the other day. She had went to the hospital and was checked out and every thing checked out. Was told to refrain from intercourse until she was given the ok from her provider. Was told to call and just to let us know

## 2020-12-10 ENCOUNTER — Other Ambulatory Visit: Payer: Self-pay | Admitting: Women's Health

## 2020-12-10 MED ORDER — PRENATAL VITAMIN 27-0.8 MG PO TABS: ORAL_TABLET | ORAL | 11 refills | Status: DC

## 2020-12-13 ENCOUNTER — Telehealth: Payer: Self-pay | Admitting: *Deleted

## 2020-12-13 ENCOUNTER — Other Ambulatory Visit: Payer: Self-pay | Admitting: Women's Health

## 2020-12-13 DIAGNOSIS — E162 Hypoglycemia, unspecified: Secondary | ICD-10-CM | POA: Diagnosis not present

## 2020-12-13 MED ORDER — CITRANATAL ASSURE 35-1 & 300 MG PO MISC: ORAL | 11 refills | Status: DC

## 2020-12-13 NOTE — Telephone Encounter (Signed)
Pharmacy called stating PNV is sent in is not covered.  Verbal order for [facility].

## 2020-12-28 ENCOUNTER — Encounter: Payer: Self-pay | Admitting: *Deleted

## 2020-12-30 ENCOUNTER — Ambulatory Visit (INDEPENDENT_AMBULATORY_CARE_PROVIDER_SITE_OTHER): Payer: Medicaid Other | Admitting: Advanced Practice Midwife

## 2020-12-30 ENCOUNTER — Other Ambulatory Visit: Payer: Self-pay

## 2020-12-30 VITALS — BP 111/65 | HR 72 | Wt 133.0 lb

## 2020-12-30 DIAGNOSIS — Z3402 Encounter for supervision of normal first pregnancy, second trimester: Secondary | ICD-10-CM

## 2020-12-30 DIAGNOSIS — Z3A23 23 weeks gestation of pregnancy: Secondary | ICD-10-CM

## 2020-12-30 NOTE — Patient Instructions (Signed)

## 2020-12-30 NOTE — Progress Notes (Signed)
   LOW-RISK PREGNANCY VISIT Patient name: Elizabeth Larsen MRN [id number]  Date of birth: 2001/08/12 Chief Complaint:   Routine Prenatal Visit  History of Present Illness:   Elizabeth Larsen is a 20 y.o. G36P0000 female at [redacted]w[redacted]d with an Estimated Date of Delivery: 04/27/21 being seen today for ongoing management of a low-risk pregnancy.  Today she reports no complaints. Contractions: Not present. Vag. Bleeding: None.  Movement: Present. denies leaking of fluid. Review of Systems:   Pertinent items are noted in HPI Denies abnormal vaginal discharge w/ itching/odor/irritation, headaches, visual changes, shortness of breath, chest pain, abdominal pain, severe nausea/vomiting, or problems with urination or bowel movements unless otherwise stated above. Pertinent History Reviewed:  Reviewed past medical,surgical, social, obstetrical and family history.  Reviewed problem list, medications and allergies. Physical Assessment:   Vitals:   12/30/20 1433  BP: 111/65  Pulse: 72  Weight: 133 lb (60.3 kg)  Body mass index is 21.47 kg/m.        Physical Examination:   General appearance: Well appearing, and in no distress  Mental status: Alert, oriented to person, place, and time  Skin: Warm & dry  Cardiovascular: Normal heart rate noted  Respiratory: Normal respiratory effort, no distress  Abdomen: Soft, gravid, nontender  Pelvic: Cervical exam deferred         Extremities: Edema: None  Fetal Status:     Movement: Present    Chaperone: n/a    No results found for this or any previous visit (from the past 24 hour(s)).  Assessment & Plan:  1) Low-risk pregnancy G1P0000 at [redacted]w[redacted]d with an Estimated Date of Delivery: 04/27/21      Meds: No orders of the defined types were placed in this encounter.  Labs/procedures today: none  Plan:  Continue routine obstetrical care  Next visit: prefers in person    Reviewed: Preterm labor symptoms and general obstetric precautions including but not  limited to vaginal bleeding, contractions, leaking of fluid and fetal movement were reviewed in detail with the patient.  All questions were answered. Has home bp cuff.. Check bp weekly, let us know if >140/90.   Follow-up: No follow-ups on file.  No orders of the defined types were placed in this encounter.  Jacklyn Shell DNP, CNM 12/30/2020 3:14 PM

## 2021-01-04 IMAGING — MR MR ABDOMEN WO/W CM
19 of 21 series · 45 of 48 positions shown · IV contrast (gadavist)
Comparison: None.

CLINICAL DATA: CLINICAL CONCERN FOR INSULINOMA.



[Series 3: T2 · coronal · 7.0mm · 1.48mm/px · 1 of 21 slices shown (1 of 2)]
[im 1/21]
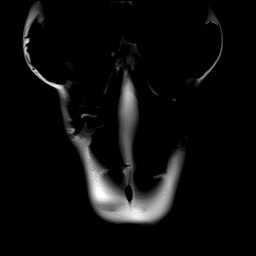

[Series 4: T2 fat-sat · axial · 6.0mm · 1.16mm/px · 1 of 36 slices shown]
[im 1/36]
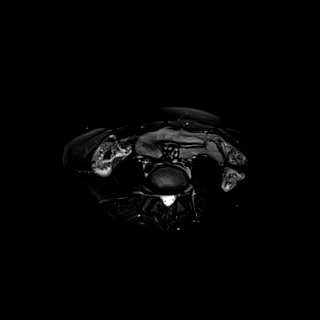

[Series 6: T1 · axial · 3.1mm · 1.16mm/px · z∈[-108,+137]mm · 3 of 80 slices shown (1 of 2)]
[im 1/80]
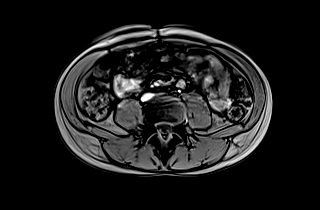
[im 40/80]
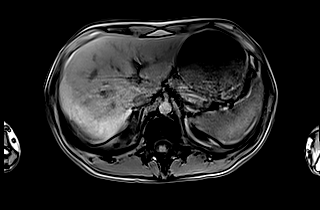
[im 80/80]
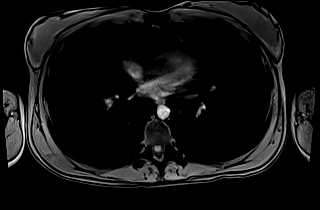

[Series 7: T1 · axial · 3.1mm · 1.16mm/px · z∈[-108,+137]mm · 3 of 80 slices shown (2 of 2)]
[im 1/80]
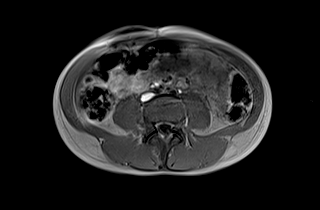
[im 40/80]
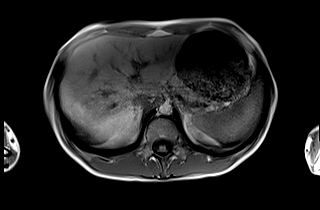
[im 80/80]
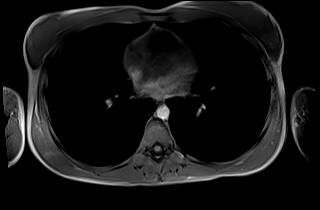

[Series 8: DWI · 1 of 11 slices shown (1 of 3)]
[im 1/11]
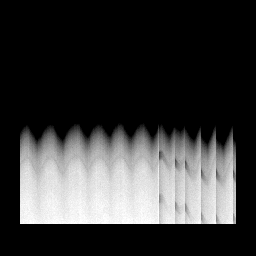

[Series 9: DWI · axial · 6.0mm · 1.38mm/px · z∈[-110,+135]mm · 4 of 105 slices shown (2 of 3)]
[im 1/105]
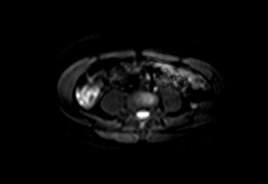
[im 35/105]
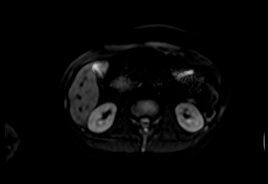
[im 70/105]
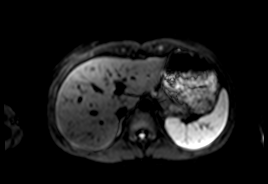
[im 105/105]
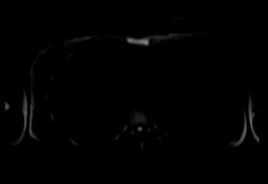

[Series 10: DWI · axial · 6.0mm · 1.38mm/px · 1 of 35 slices shown (3 of 3)]
[im 1/35]
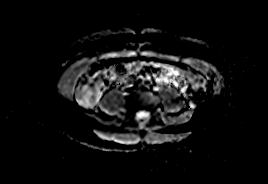

[Series 11: bSSFP · axial · 7.0mm · 1.16mm/px · 1 of 38 slices shown]
[im 1/38]
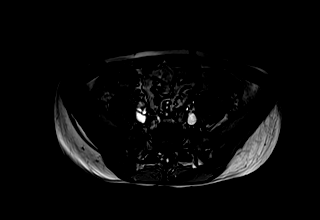

[Series 14: T1 dynamic · axial · 3.0mm · 1.16mm/px · z∈[-161,+124]mm · 3 of 96 slices shown (1 of 6)]
[im 1/96]
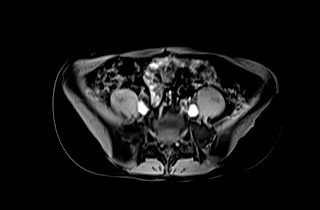
[im 48/96]
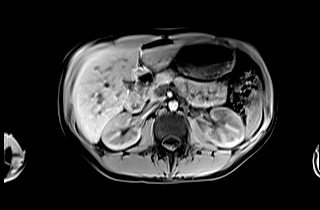
[im 96/96]
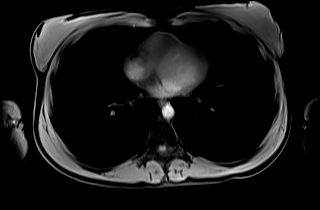

[Series 17: T1 dynamic · axial · 3.0mm · 1.16mm/px · z∈[-161,+124]mm · 3 of 96 slices shown (2 of 6)]
[im 1/96]
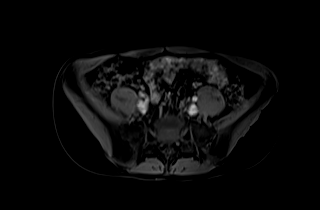
[im 48/96]
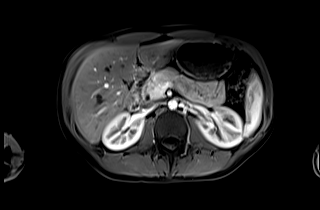
[im 96/96]
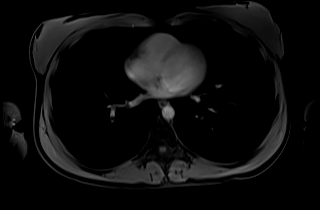

[Series 19: T1 dynamic · axial · 3.0mm · 1.16mm/px · z∈[-161,+124]mm · 3 of 96 slices shown (3 of 6)]
[im 1/96]
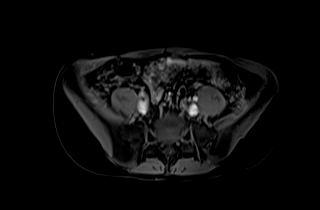
[im 48/96]
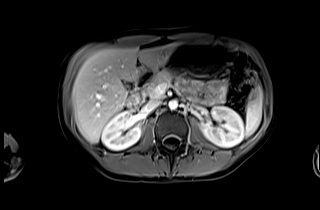
[im 96/96]
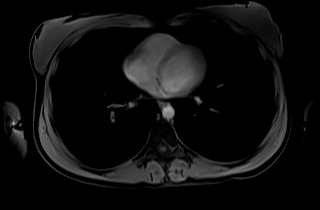

[Series 21: T1 dynamic · axial · 3.0mm · 1.16mm/px · z∈[-161,+124]mm · 3 of 96 slices shown (4 of 6)]
[im 1/96]
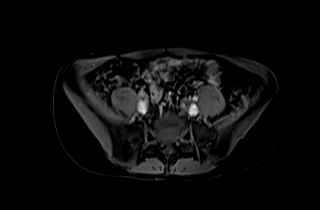
[im 48/96]
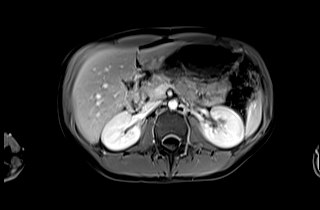
[im 96/96]
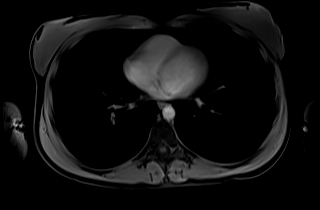

[Series 23: T1 dynamic · coronal · 3.0mm · 1.22mm/px · 2 of 64 slices shown (5 of 6)]
[im 1/64]
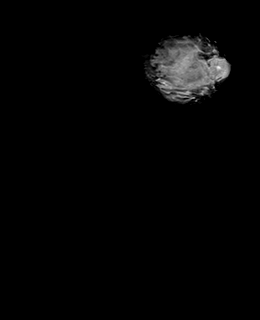
[im 64/64]
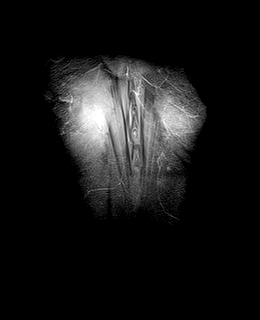

[Series 24: T2 · axial · 6.0mm · 1.56mm/px · 1 of 37 slices shown (2 of 2)]
[im 1/37]
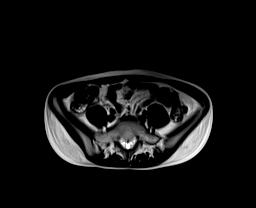

[Series 26: T1 dynamic · axial · 3.0mm · 1.16mm/px · z∈[-161,+124]mm · 3 of 96 slices shown (6 of 6)]
[im 1/96]
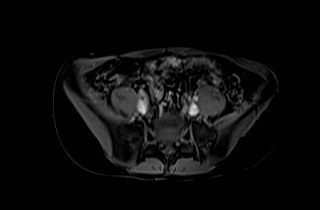
[im 48/96]
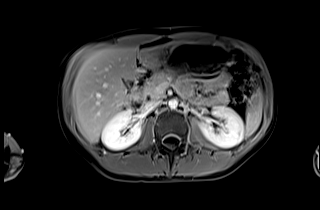
[im 96/96]
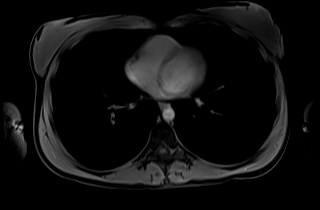

[Series 100: subtraction (id) · axial · 3.0mm · 1.16mm/px · z∈[-161,+124]mm · 3 of 96 slices shown]
[im 1/96]
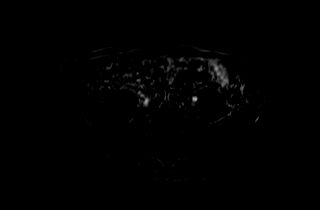
[im 48/96]
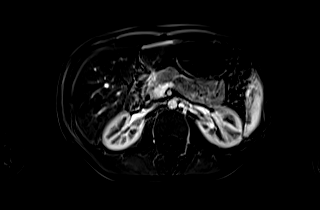
[im 96/96]
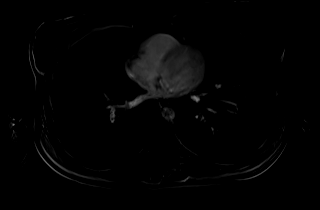

[Series 101: subtraction 45 sec · axial · 3.0mm · 1.16mm/px · z∈[-161,+124]mm · 3 of 96 slices shown]
[im 1/96]
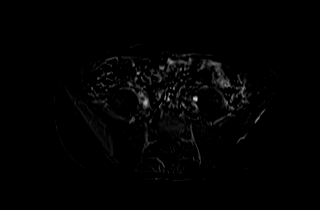
[im 48/96]
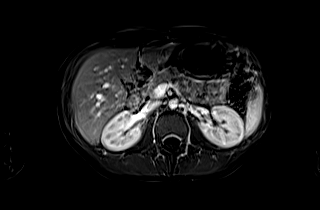
[im 96/96]
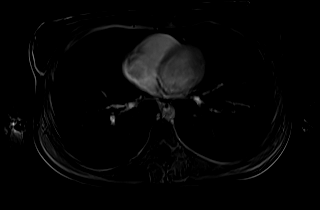

[Series 102: subtraction 90 sec · axial · 3.0mm · 1.16mm/px · z∈[-161,+124]mm · 3 of 96 slices shown]
[im 1/96]
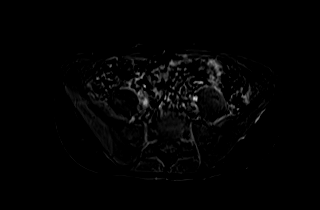
[im 48/96]
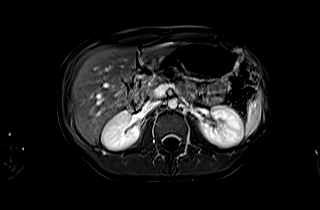
[im 96/96]
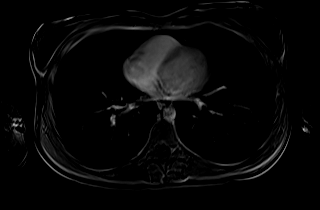

[Series 103: subtraction 3 min · axial · 3.0mm · 1.16mm/px · z∈[-161,+124]mm · 3 of 96 slices shown]
[im 1/96]
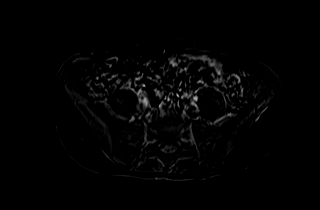
[im 48/96]
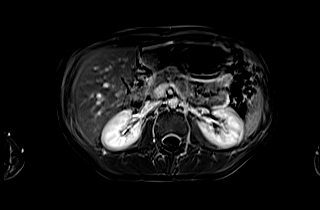
[im 96/96]
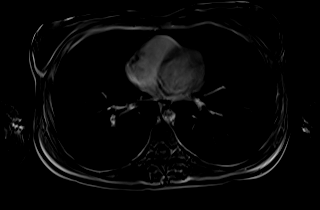

[45 of 48 positions shown; findings below may reference images not displayed]

FINDINGS: Lower chest: Unremarkable.

Hepatobiliary: No focal abnormality in the liver parenchyma. There
is no evidence for gallstones, gallbladder wall thickening, or
pericholecystic fluid. No intrahepatic or extrahepatic biliary
dilation.

Pancreas: No focal mass lesion evident. Specifically, no evidence
for hyperintense lesion on T2 imaging and no hypervascular lesion on
postcontrast imaging. No restricted diffusion within the pancreas.

Spleen:  No splenomegaly. No focal mass lesion.

Adrenals/Urinary Tract: No adrenal nodule or mass. Kidneys
unremarkable.

Stomach/Bowel: No mass lesion is evident in the stomach or duodenum
although assessment is limited by bowel motion. No small bowel or
colonic dilatation within the visualized abdomen.

Vascular/Lymphatic: No abdominal aortic aneurysm. No abdominal
aortic atherosclerotic calcification. There is no gastrohepatic or
hepatoduodenal ligament lymphadenopathy. No retroperitoneal or
mesenteric lymphadenopathy.

Other:  No intraperitoneal free fluid.

Musculoskeletal: No focal suspicious marrow enhancement within the
visualized bony anatomy.
IMPRESSION: 1. No evidence for focal lesion within the pancreas to suggest
pancreatic neuroendocrine tumor.
2. Ja-VN DOTATATE PET-CT may prove helpful to further evaluate as
clinically warranted.

## 2021-01-27 ENCOUNTER — Encounter: Payer: Self-pay | Admitting: Obstetrics & Gynecology

## 2021-01-27 ENCOUNTER — Ambulatory Visit: Payer: Medicaid Other | Admitting: Obstetrics & Gynecology

## 2021-01-27 ENCOUNTER — Other Ambulatory Visit: Payer: Self-pay | Admitting: *Deleted

## 2021-01-27 ENCOUNTER — Other Ambulatory Visit: Payer: Self-pay

## 2021-01-27 ENCOUNTER — Encounter: Payer: Self-pay | Admitting: *Deleted

## 2021-01-27 ENCOUNTER — Other Ambulatory Visit: Payer: Medicaid Other

## 2021-01-27 VITALS — BP 103/67 | HR 93 | Wt 139.0 lb

## 2021-01-27 DIAGNOSIS — Z131 Encounter for screening for diabetes mellitus: Secondary | ICD-10-CM | POA: Diagnosis not present

## 2021-01-27 DIAGNOSIS — N6002 Solitary cyst of left breast: Secondary | ICD-10-CM

## 2021-01-27 DIAGNOSIS — Z3402 Encounter for supervision of normal first pregnancy, second trimester: Secondary | ICD-10-CM

## 2021-01-27 DIAGNOSIS — Z3A27 27 weeks gestation of pregnancy: Secondary | ICD-10-CM | POA: Diagnosis not present

## 2021-01-27 NOTE — Progress Notes (Signed)
   LOW-RISK PREGNANCY VISIT Patient name: Elizabeth Larsen MRN [id number]  Date of birth: 26-Sep-2001 Chief Complaint:   Routine Prenatal Visit (PN2 today)  History of Present Illness:   Elizabeth Larsen is a 20 y.o. G64P0000 female at [redacted]w[redacted]d with an Estimated Date of Delivery: 04/27/21 being seen today for ongoing management of a low-risk pregnancy.   Depression screen [facility] 2/9 01/27/2021 10/13/2020 09/07/2020 07/23/2020 07/06/2020  Decreased Interest 0 0 0 0 0  Down, Depressed, Hopeless 0 0 0 0 0  PHQ - 2 Score 0 0 0 0 0  Altered sleeping 2 0 1 0 2  Tired, decreased energy 1 0 0 0 0  Change in appetite 0 0 0 0 0  Feeling bad or failure about yourself  0 0 0 0 0  Trouble concentrating 0 0 0 0 0  Moving slowly or fidgety/restless 0 0 0 0 1  Suicidal thoughts 0 0 0 0 0  PHQ-9 Score 3 0 1 0 3  Difficult doing work/chores - - - - Not difficult at all    Today she reports occasional backache, but minimal. Contractions: Not present. Vag. Bleeding: None.  Movement: Present. denies leaking of fluid. Review of Systems:   Pertinent items are noted in HPI Denies abnormal vaginal discharge w/ itching/odor/irritation, headaches, visual changes, shortness of breath, chest pain, abdominal pain, severe nausea/vomiting, or problems with urination or bowel movements unless otherwise stated above. Pertinent History Reviewed:  Reviewed past medical,surgical, social, obstetrical and family history.  Reviewed problem list, medications and allergies.  Physical Assessment:   Vitals:   01/27/21 0859  BP: 103/67  Pulse: 93  Weight: 139 lb (63 kg)  Body mass index is 22.44 kg/m.        Physical Examination:   General appearance: Well appearing, and in no distress  Mental status: Alert, oriented to person, place, and time  Skin: Warm & dry  Breast: left breast with ~ 2cm palpable firm, non-tender, mobile mass  Respiratory: Normal respiratory effort, no distress  Abdomen: Soft, gravid,  nontender  Pelvic: Cervical exam deferred         Extremities: Edema: None  Psych:  mood and affect appropriate  Fetal Status: Fetal Heart Rate (bpm): 140 Fundal Height: 26 cm Movement: Present    Chaperone: n/a    Assessment & Plan:  1) Low-risk pregnancy G1P0000 at [redacted]w[redacted]d with an Estimated Date of Delivery: 04/27/21   2) Left breast mass- plan for diagnostic imaging   Meds: No orders of the defined types were placed in this encounter.  Labs/procedures today: PN2  Plan:  Continue routine obstetrical care, []  consider TDAP next visit  Next visit: prefers in person    Reviewed: Preterm labor symptoms and general obstetric precautions including but not limited to vaginal bleeding, contractions, leaking of fluid and fetal movement were reviewed in detail with the patient.  All questions were answered. Pt has home bp cuff. Check bp weekly, let know if >140/90.   Follow-up: Return in about 3 weeks (around 02/17/2021) for LROB visit.  Orders Placed This Encounter  Procedures  . 02/19/2021 BREAST COMPLETE UNI LEFT INC AXILLA    [place], DO Attending Obstetrician & Gynecologist, [facility] for [facility]

## 2021-01-28 ENCOUNTER — Telehealth: Payer: Self-pay | Admitting: *Deleted

## 2021-01-28 LAB — CBC
Hematocrit: 31.5 % — ABNORMAL LOW (ref 34.0–46.6)
Hemoglobin: 10.7 g/dL — ABNORMAL LOW (ref 11.1–15.9)
MCH: 30.1 pg (ref 26.6–33.0)
MCHC: 34 g/dL (ref 31.5–35.7)
MCV: 89 fL (ref 79–97)
Platelets: 291 10*3/uL (ref 150–450)
RBC: 3.55 x10E6/uL — ABNORMAL LOW (ref 3.77–5.28)
RDW: 13 % (ref 11.7–15.4)
WBC: 11.9 10*3/uL — ABNORMAL HIGH (ref 3.4–10.8)

## 2021-01-28 LAB — GLUCOSE TOLERANCE, 2 HOURS W/ 1HR
Glucose, 1 hour: 97 mg/dL (ref 65–179)
Glucose, 2 hour: 96 mg/dL (ref 65–152)
Glucose, Fasting: 78 mg/dL (ref 65–91)

## 2021-01-28 LAB — ANTIBODY SCREEN: Antibody Screen: NEGATIVE

## 2021-01-28 LAB — SYPHILIS: RPR W/REFLEX TO RPR TITER AND TREPONEMAL ANTIBODIES, TRADITIONAL SCREENING AND DIAGNOSIS ALGORITHM: RPR Ser Ql: NONREACTIVE

## 2021-01-28 LAB — HIV ANTIBODY (ROUTINE TESTING W REFLEX): HIV Screen 4th Generation wRfx: NONREACTIVE

## 2021-01-28 NOTE — Telephone Encounter (Signed)
Spoke to patient. All questions answered with patient verbalizing understanding.

## 2021-02-01 ENCOUNTER — Other Ambulatory Visit (HOSPITAL_COMMUNITY): Payer: Self-pay | Admitting: Obstetrics & Gynecology

## 2021-02-01 ENCOUNTER — Other Ambulatory Visit: Payer: Self-pay

## 2021-02-01 ENCOUNTER — Ambulatory Visit (HOSPITAL_COMMUNITY)
Admission: RE | Admit: 2021-02-01 | Discharge: 2021-02-01 | Disposition: A | Discharge status: Home / Self Care | Payer: Medicaid Other | Source: Ambulatory Visit | Attending: Obstetrics & Gynecology | Admitting: Obstetrics & Gynecology

## 2021-02-01 DIAGNOSIS — N6002 Solitary cyst of left breast: Secondary | ICD-10-CM

## 2021-02-01 DIAGNOSIS — N6322 Unspecified lump in the left breast, upper inner quadrant: Secondary | ICD-10-CM | POA: Diagnosis not present

## 2021-02-01 DIAGNOSIS — N6324 Unspecified lump in the left breast, lower inner quadrant: Secondary | ICD-10-CM | POA: Diagnosis not present

## 2021-02-01 DIAGNOSIS — R928 Other abnormal and inconclusive findings on diagnostic imaging of breast: Secondary | ICD-10-CM

## 2021-02-08 ENCOUNTER — Ambulatory Visit (HOSPITAL_COMMUNITY)
Admission: RE | Admit: 2021-02-08 | Discharge: 2021-02-08 | Disposition: A | Discharge status: Home / Self Care | Payer: Medicaid Other | Source: Ambulatory Visit | Attending: Obstetrics & Gynecology | Admitting: Obstetrics & Gynecology

## 2021-02-08 ENCOUNTER — Encounter (HOSPITAL_COMMUNITY): Payer: Self-pay

## 2021-02-08 ENCOUNTER — Other Ambulatory Visit: Payer: Self-pay

## 2021-02-08 DIAGNOSIS — D242 Benign neoplasm of left breast: Secondary | ICD-10-CM | POA: Diagnosis not present

## 2021-02-08 DIAGNOSIS — R928 Other abnormal and inconclusive findings on diagnostic imaging of breast: Secondary | ICD-10-CM | POA: Insufficient documentation

## 2021-02-08 DIAGNOSIS — N6325 Unspecified lump in the left breast, overlapping quadrants: Secondary | ICD-10-CM | POA: Diagnosis not present

## 2021-02-08 MED ORDER — LIDOCAINE HCL (PF) 2 % IJ SOLN
INTRAMUSCULAR | Status: AC
  Administered 2021-02-08: 10 mL
  Filled 2021-02-08: qty 10

## 2021-02-08 MED ORDER — LIDOCAINE-EPINEPHRINE (PF) 1 %-1:200000 IJ SOLN
INTRAMUSCULAR | Status: AC
  Administered 2021-02-08: 10 mL
  Filled 2021-02-08: qty 30

## 2021-02-08 NOTE — Discharge Instructions (Signed)
Breast Biopsy, Care After °These instructions give you information about caring for yourself after your procedure. Your doctor may also give you more specific instructions. Call your doctor if you have any problems or questions after your procedure. °What can I expect after the procedure? °After your procedure, it is common to have: °· Bruising on your breast. °· Numbness, tingling, or pain near your biopsy site. °Follow these instructions at home: °Medicines °· Take over-the-counter and prescription medicines only as told by your doctor. °· Do not drive for 24 hours if you were given a medicine to help you relax (sedative) during your procedure. °· Do not drink alcohol while taking pain medicine. °· Do not drive or use heavy machinery while taking prescription pain medicine. °Biopsy site care °· Follow instructions from your doctor about how to take care of your cut from surgery (incision) or your puncture area. Make sure you: °? Wash your hands with soap and water before you change your bandage (dressing). If you cannot use soap and water, use hand sanitizer. °? Change your bandage as told by your doctor. °? Leave stitches (sutures), skin glue, or skin tape (adhesive strips) in place. They may need to stay in place for 2 weeks or longer. If tape strips get loose and curl up, you may trim the loose edges. Do not remove tape strips completely unless your doctor says it is okay. °· If you have stitches, keep them dry when you take a bath or a shower. °· Check your cut or puncture area every day for signs of infection. Check for: °? Redness, swelling, or pain. °? Fluid or blood. °? Warmth. °? Pus or a bad smell. °· Protect the biopsy area. Do not let the area get bumped.  °  °  °Activity °· If you had a cut during your procedure, avoid activities that could pull your cut open. These include: °? Stretching. °? Reaching over your head. °? Exercise. °? Sports. °? Lifting anything that weighs more than 3 lb (1.4  kg). °· Return to your normal activities as told by your doctor. Ask your doctor what activities are safe for you. °Managing pain, stiffness, and swelling °If told, put ice on the biopsy site to relieve swelling: °· Put ice in a plastic bag. °· Place a towel between your skin and the bag. °· Leave the ice on for 20 minutes, 2-3 times a day. °General instructions °· Continue your normal diet. °· Wear a good support bra for as long as told by your doctor. °· Get checked for extra fluid around your lymph nodes (lymphedema) as often as told by your doctor. °· Keep all follow-up visits as told by your doctor. This is important. °Contact a doctor if: °· You notice any of the following at the biopsy site: °? More redness, swelling, or pain. °? More fluid or blood coming from the site. °? The site feels warm to the touch. °? Pus or a bad smell coming from the site. °? The site breaks open after the stitches or skin tape strips have been removed. °· You have a rash. °· You have a fever. °Get help right away if: °· You have more bleeding from the biopsy site. Get help right away if bleeding is more than a small spot. °· You have trouble breathing. °· You have red streaks around the biopsy site. °Summary °· After your procedure, it is common to have bruising, numbness, tingling, or pain near the biopsy site. °· Do not   drive or use heavy machinery while taking prescription pain medicine. °· Wear a good support bra for as long as told by your doctor. °· If you had a cut during your procedure, avoid activities that may pull the cut open. Ask your doctor what activities are safe for you. °This information is not intended to replace advice given to you by your health care provider. Make sure you discuss any questions you have with your health care provider. °Document Revised: 07/19/2020 Document Reviewed: 04/25/2018 °Elsevier Patient Education © 2021 [employer] ° °

## 2021-02-08 NOTE — Sedation Documentation (Signed)
PT tolerated left breast biopsy well today with NAD noted. PT verbalized understanding of discharge instructions and ambulatory at discharge.

## 2021-02-09 ENCOUNTER — Encounter: Payer: Self-pay | Admitting: *Deleted

## 2021-02-09 LAB — SURGICAL PATHOLOGY

## 2021-02-11 ENCOUNTER — Telehealth: Payer: Self-pay

## 2021-02-11 DIAGNOSIS — J0100 Acute maxillary sinusitis, unspecified: Secondary | ICD-10-CM | POA: Diagnosis not present

## 2021-02-11 DIAGNOSIS — R070 Pain in throat: Secondary | ICD-10-CM | POA: Diagnosis not present

## 2021-02-11 NOTE — Telephone Encounter (Signed)
Pt was prescribed Amoxicillin for a sinus infection and was making sure it was safe while pregnant. Pt was advised Amoxicillin was safe while pregnant. Pt voiced understanding. JSY

## 2021-02-11 NOTE — Telephone Encounter (Signed)
Pt was prescribed some medication and was advised to consult with OB before starting the medication.  She stated it was amoxicillin and 875 mg potassium ans 125 mg clavulanate. Pt will be looking for a my chart medication about these medications

## 2021-02-17 ENCOUNTER — Other Ambulatory Visit: Payer: Self-pay

## 2021-02-17 ENCOUNTER — Ambulatory Visit (INDEPENDENT_AMBULATORY_CARE_PROVIDER_SITE_OTHER): Payer: Medicaid Other | Admitting: Women's Health

## 2021-02-17 ENCOUNTER — Encounter: Payer: Self-pay | Admitting: Women's Health

## 2021-02-17 VITALS — BP 96/64 | HR 70 | Wt 142.6 lb

## 2021-02-17 DIAGNOSIS — N63 Unspecified lump in unspecified breast: Secondary | ICD-10-CM | POA: Insufficient documentation

## 2021-02-17 DIAGNOSIS — Z3403 Encounter for supervision of normal first pregnancy, third trimester: Secondary | ICD-10-CM

## 2021-02-17 NOTE — Progress Notes (Signed)
   LOW-RISK PREGNANCY VISIT Patient name: Elizabeth Larsen MRN [id number]  Date of birth: [birth year] Chief Complaint:   Routine Prenatal Visit  History of Present Illness:   Elizabeth Larsen is a 20 y.o. G68P0000 female at [redacted]w[redacted]d with an Estimated Date of Delivery: 04/27/21 being seen today for ongoing management of a low-risk pregnancy.  Depression screen [facility] 2/9 01/27/2021 10/13/2020 09/07/2020 07/23/2020 07/06/2020  Decreased Interest 0 0 0 0 0  Down, Depressed, Hopeless 0 0 0 0 0  PHQ - 2 Score 0 0 0 0 0  Altered sleeping 2 0 1 0 2  Tired, decreased energy 1 0 0 0 0  Change in appetite 0 0 0 0 0  Feeling bad or failure about yourself  0 0 0 0 0  Trouble concentrating 0 0 0 0 0  Moving slowly or fidgety/restless 0 0 0 0 1  Suicidal thoughts 0 0 0 0 0  PHQ-9 Score 3 0 1 0 3  Difficult doing work/chores - - - - Not difficult at all    Today she reports no complaints. Contractions: Not present. Vag. Bleeding: None.  Movement: Present. denies leaking of fluid. Review of Systems:   Pertinent items are noted in HPI Denies abnormal vaginal discharge w/ itching/odor/irritation, headaches, visual changes, shortness of breath, chest pain, abdominal pain, severe nausea/vomiting, or problems with urination or bowel movements unless otherwise stated above. Pertinent History Reviewed:  Reviewed past medical,surgical, social, obstetrical and family history.  Reviewed problem list, medications and allergies. Physical Assessment:   Vitals:   02/17/21 1438  BP: 96/64  Pulse: 70  Weight: 142 lb 9.6 oz (64.7 kg)  Body mass index is 23.02 kg/m.        Physical Examination:   General appearance: Well appearing, and in no distress  Mental status: Alert, oriented to person, place, and time  Skin: Warm & dry  Cardiovascular: Normal heart rate noted  Respiratory: Normal respiratory effort, no distress  Abdomen: Soft, gravid, nontender  Pelvic: Cervical exam deferred         Extremities: Edema:  None  Fetal Status: Fetal Heart Rate (bpm): 135 Fundal Height: 28 cm Movement: Present    Chaperone: N/A   No results found for this or any previous visit (from the past 24 hour(s)).  Assessment & Plan:  1) Low-risk pregnancy G1P0000 at [redacted]w[redacted]d with an Estimated Date of Delivery: 04/27/21    Meds: No orders of the defined types were placed in this encounter.  Labs/procedures today: declined flu shot and declined tdap  Plan:  Continue routine obstetrical care  Next visit: prefers in person    Reviewed: Preterm labor symptoms and general obstetric precautions including but not limited to vaginal bleeding, contractions, leaking of fluid and fetal movement were reviewed in detail with the patient.  All questions were answered.   Follow-up: Return in about 2 weeks (around 03/03/2021) for LROB, CNM, in person.  No future appointments.  No orders of the defined types were placed in this encounter.  Cheral Marker CNM, [facility] 02/17/2021 2:55 PM

## 2021-02-17 NOTE — Patient Instructions (Signed)
Elizabeth Larsen, I greatly value your feedback.  If you receive a survey following your visit with [place] today, we appreciate you taking the time to fill it out.  Thanks, [name], CNM, WHNP-BC   [facility] ([address] [place], [place] [address]) Entrance C, located off of [address] valet parking  Go to [email or web address] to register for FREE online childbirth classes   Call the office [phone]) or go to [facility] if:  You begin to have strong, frequent contractions  Your water breaks.  Sometimes it is a big gush of fluid, sometimes it is just a trickle that keeps getting your panties wet or running down your legs  You have vaginal bleeding.  It is normal to have a small amount of spotting if your cervix was checked.   You don't feel your baby moving like normal.  If you don't, get you something to eat and drink and lay down and focus on feeling your baby move.  You should feel at least 10 movements in 2 hours.  If you don't, you should call the office or go to [facility] [place] [facility].    Tdap Vaccine  It is recommended that you get the Tdap vaccine during the third trimester of EACH pregnancy to help protect your baby from getting pertussis (whooping cough)  27-36 weeks is the BEST time to do this so that you can pass the protection on to your baby. During pregnancy is better than after pregnancy, but if you are unable to get it during pregnancy it will be offered at the hospital.   You can get this vaccine with [place], at the health department, your family doctor, or some local pharmacies  Everyone who will be around your baby should also be up-to-date on their vaccines before the baby comes. Adults (who are not pregnant) only need 1 dose of Tdap during adulthood.   [place] Pediatricians/Family Doctors:  [facility] [phone]            [facility] [phone]                 [facility] [phone] (usually not accepting new patients unless you have family there already, you are always welcome to call and ask)       [facility] [phone]       [facility] Pediatricians/Family Doctors:   [facility]: [phone]  [facility]: [phone]  [facility]: [phone]  [facility] Doctors:   [facility]: [phone]   [facility]: [phone]  [facility] Doctors:  [facility]: [phone]   Home Blood Pressure Monitoring for Patients   Your provider has recommended that you check your blood pressure (BP) at least once a week at home. If you do not have a blood pressure cuff at home, one will be provided for you. Contact your provider if you have not received your monitor within 1 week.   Helpful Tips for Accurate Home Blood Pressure Checks  . Don't smoke, exercise, or drink caffeine 30 minutes before checking your BP . Use the restroom before checking your BP (a full bladder can raise your pressure) . Relax in a comfortable upright chair . Feet on the ground . Left arm resting comfortably on a flat surface at the level of your heart . Legs uncrossed . Back supported . Sit quietly and don't talk . Place the cuff on your  bare arm . Adjust snuggly, so that only two fingertips can fit between your skin and the top of the cuff . Check 2 readings separated by at least one minute . Keep a log of your BP readings . For a visual, please reference this diagram: http://ccnc.care/bpdiagram  Provider Name: [facility]     Phone: [phone]  Zone 1: ALL CLEAR  Continue to monitor your symptoms:  . BP reading is less than 140 (top number) or less than 90 (bottom number)  . No right upper stomach pain . No headaches or seeing spots . No feeling nauseated or throwing up . No swelling in face and hands  Zone 2: CAUTION Call  your doctor's office for any of the following:  . BP reading is greater than 140 (top number) or greater than 90 (bottom number)  . Stomach pain under your ribs in the middle or right side . Headaches or seeing spots . Feeling nauseated or throwing up . Swelling in face and hands  Zone 3: EMERGENCY  Seek immediate medical care if you have any of the following:  . BP reading is greater than160 (top number) or greater than 110 (bottom number) . Severe headaches not improving with Tylenol . Serious difficulty catching your breath . Any worsening symptoms from Zone 2   Third Trimester of Pregnancy The third trimester is from week 29 through week 42, months 7 through 9. The third trimester is a time when the fetus is growing rapidly. At the end of the ninth month, the fetus is about 20 inches in length and weighs 6-10 pounds.  BODY CHANGES Your body goes through many changes during pregnancy. The changes vary from woman to woman.   Your weight will continue to increase. You can expect to gain 25-35 pounds (11-16 kg) by the end of the pregnancy.  You may begin to get stretch marks on your hips, abdomen, and breasts.  You may urinate more often because the fetus is moving lower into your pelvis and pressing on your bladder.  You may develop or continue to have heartburn as a result of your pregnancy.  You may develop constipation because certain hormones are causing the muscles that push waste through your intestines to slow down.  You may develop hemorrhoids or swollen, bulging veins (varicose veins).  You may have pelvic pain because of the weight gain and pregnancy hormones relaxing your joints between the bones in your pelvis. Backaches may result from overexertion of the muscles supporting your posture.  You may have changes in your hair. These can include thickening of your hair, rapid growth, and changes in texture. Some women also have hair loss during or after pregnancy, or hair  that feels dry or thin. Your hair will most likely return to normal after your baby is born.  Your breasts will continue to grow and be tender. A yellow discharge may leak from your breasts called colostrum.  Your belly button may stick out.  You may feel short of breath because of your expanding uterus.  You may notice the fetus "dropping," or moving lower in your abdomen.  You may have a bloody mucus discharge. This usually occurs a few days to a week before labor begins.  Your cervix becomes thin and soft (effaced) near your due date. WHAT TO EXPECT AT YOUR PRENATAL EXAMS  You will have prenatal exams every 2 weeks until week 36. Then, you will have weekly prenatal exams. During a routine prenatal visit:  You will be weighed to make sure you and the fetus are growing normally.  Your blood pressure is taken.  Your abdomen will be measured to track your baby's growth.  The fetal heartbeat will be listened to.  Any test results from the previous visit will be discussed.  You may have a cervical check near your due date to see if you have effaced. At around 36 weeks, your caregiver will check your cervix. At the same time, your caregiver will also perform a test on the secretions of the vaginal tissue. This test is to determine if a type of bacteria, Group B streptococcus, is present. Your caregiver will explain this further. Your caregiver may ask you:  What your birth plan is.  How you are feeling.  If you are feeling the baby move.  If you have had any abnormal symptoms, such as leaking fluid, bleeding, severe headaches, or abdominal cramping.  If you have any questions. Other tests or screenings that may be performed during your third trimester include:  Blood tests that check for low iron levels (anemia).  Fetal testing to check the health, activity level, and growth of the fetus. Testing is done if you have certain medical conditions or if there are problems during the  pregnancy. FALSE LABOR You may feel small, irregular contractions that eventually go away. These are called Braxton Hicks contractions, or false labor. Contractions may last for hours, days, or even weeks before true labor sets in. If contractions come at regular intervals, intensify, or become painful, it is best to be seen by your caregiver.  SIGNS OF LABOR   Menstrual-like cramps.  Contractions that are 5 minutes apart or less.  Contractions that start on the top of the uterus and spread down to the lower abdomen and back.  A sense of increased pelvic pressure or back pain.  A watery or bloody mucus discharge that comes from the vagina. If you have any of these signs before the 37th week of pregnancy, call your caregiver right away. You need to go to the hospital to get checked immediately. HOME CARE INSTRUCTIONS   Avoid all smoking, herbs, alcohol, and unprescribed drugs. These chemicals affect the formation and growth of the baby.  Follow your caregiver's instructions regarding medicine use. There are medicines that are either safe or unsafe to take during pregnancy.  Exercise only as directed by your caregiver. Experiencing uterine cramps is a good sign to stop exercising.  Continue to eat regular, healthy meals.  Wear a good support bra for breast tenderness.  Do not use hot tubs, steam rooms, or saunas.  Wear your seat belt at all times when driving.  Avoid raw meat, uncooked cheese, cat litter boxes, and soil used by cats. These carry germs that can cause birth defects in the baby.  Take your prenatal vitamins.  Try taking a stool softener (if your caregiver approves) if you develop constipation. Eat more high-fiber foods, such as fresh vegetables or fruit and whole grains. Drink plenty of fluids to keep your urine clear or pale yellow.  Take warm sitz baths to soothe any pain or discomfort caused by hemorrhoids. Use hemorrhoid cream if your caregiver approves.  If you  develop varicose veins, wear support hose. Elevate your feet for 15 minutes, 3-4 times a day. Limit salt in your diet.  Avoid heavy lifting, wear low heal shoes, and practice good posture.  Rest a lot with your legs elevated if you have leg cramps or low  back pain.  Visit your dentist if you have not gone during your pregnancy. Use a soft toothbrush to brush your teeth and be gentle when you floss.  A sexual relationship may be continued unless your caregiver directs you otherwise.  Do not travel far distances unless it is absolutely necessary and only with the approval of your caregiver.  Take prenatal classes to understand, practice, and ask questions about the labor and delivery.  Make a trial run to the hospital.  Pack your hospital bag.  Prepare the baby's nursery.  Continue to go to all your prenatal visits as directed by your caregiver. SEEK MEDICAL CARE IF:  You are unsure if you are in labor or if your water has broken.  You have dizziness.  You have mild pelvic cramps, pelvic pressure, or nagging pain in your abdominal area.  You have persistent nausea, vomiting, or diarrhea.  You have a bad smelling vaginal discharge.  You have pain with urination. SEEK IMMEDIATE MEDICAL CARE IF:   You have a fever.  You are leaking fluid from your vagina.  You have spotting or bleeding from your vagina.  You have severe abdominal cramping or pain.  You have rapid weight loss or gain.  You have shortness of breath with chest pain.  You notice sudden or extreme swelling of your face, hands, ankles, feet, or legs.  You have not felt your baby move in over an hour.  You have severe headaches that do not go away with medicine.  You have vision changes. Document Released: 10/31/2001 Document Revised: 11/11/2013 Document Reviewed: 01/07/2013 [facility] Patient Information 2015 [place], [place]. This information is not intended to replace advice given to you by your health  care provider. Make sure you discuss any questions you have with your health care provider.

## 2021-03-01 ENCOUNTER — Encounter (HOSPITAL_COMMUNITY): Payer: Self-pay | Admitting: Obstetrics and Gynecology

## 2021-03-01 ENCOUNTER — Telehealth: Payer: Self-pay | Admitting: Women's Health

## 2021-03-01 ENCOUNTER — Inpatient Hospital Stay (HOSPITAL_COMMUNITY)
Admission: AD | Admit: 2021-03-01 | Discharge: 2021-03-01 | Disposition: A | Discharge status: Home / Self Care | Payer: Medicaid Other | Attending: Obstetrics and Gynecology | Admitting: Obstetrics and Gynecology

## 2021-03-01 ENCOUNTER — Other Ambulatory Visit: Payer: Self-pay

## 2021-03-01 DIAGNOSIS — O4700 False labor before 37 completed weeks of gestation, unspecified trimester: Secondary | ICD-10-CM

## 2021-03-01 DIAGNOSIS — F17290 Nicotine dependence, other tobacco product, uncomplicated: Secondary | ICD-10-CM | POA: Diagnosis not present

## 2021-03-01 DIAGNOSIS — Z3A32 32 weeks gestation of pregnancy: Secondary | ICD-10-CM

## 2021-03-01 DIAGNOSIS — O99891 Other specified diseases and conditions complicating pregnancy: Secondary | ICD-10-CM

## 2021-03-01 DIAGNOSIS — O4693 Antepartum hemorrhage, unspecified, third trimester: Secondary | ICD-10-CM | POA: Diagnosis not present

## 2021-03-01 DIAGNOSIS — O99333 Smoking (tobacco) complicating pregnancy, third trimester: Secondary | ICD-10-CM | POA: Insufficient documentation

## 2021-03-01 DIAGNOSIS — N888 Other specified noninflammatory disorders of cervix uteri: Secondary | ICD-10-CM

## 2021-03-01 DIAGNOSIS — Z3A31 31 weeks gestation of pregnancy: Secondary | ICD-10-CM | POA: Diagnosis not present

## 2021-03-01 DIAGNOSIS — O26853 Spotting complicating pregnancy, third trimester: Secondary | ICD-10-CM | POA: Diagnosis not present

## 2021-03-01 LAB — URINALYSIS, ROUTINE W REFLEX MICROSCOPIC
Bilirubin Urine: NEGATIVE
Glucose, UA: NEGATIVE mg/dL
Ketones, ur: NEGATIVE mg/dL
Nitrite: NEGATIVE
Protein, ur: NEGATIVE mg/dL
Specific Gravity, Urine: 1.009 (ref 1.005–1.030)
WBC, UA: >50 WBC/hpf — ABNORMAL HIGH (ref 0–5)
pH: 6 (ref 5.0–8.0)

## 2021-03-01 LAB — WET PREP, GENITAL
Clue Cells Wet Prep HPF POC: NONE SEEN
Sperm: NONE SEEN
Trich, Wet Prep: NONE SEEN
Yeast Wet Prep HPF POC: NONE SEEN

## 2021-03-01 MED ORDER — NIFEDIPINE 10 MG PO CAPS
10.0000 mg | ORAL_CAPSULE | ORAL | Status: DC | PRN
Start: 2021-03-01 — End: 2021-03-02
  Administered 2021-03-01 (×2): 10 mg via ORAL
  Filled 2021-03-01 (×2): qty 1

## 2021-03-01 MED ORDER — MONSELS FERRIC SUBSULFATE EX SOLN
Freq: Once | CUTANEOUS | Status: DC
  Filled 2021-03-01: qty 8

## 2021-03-01 NOTE — MAU Provider Note (Signed)
Chief Complaint:  Vaginal Bleeding   Event Date/Time   First Provider Initiated Contact with Patient 03/01/21 2014     HPI: Elizabeth Larsen is a 20 y.o. G1P0000 at 15w6dho presents to maternity admissions reporting vaginal bleeding today with pelvic cramping.  [facility] and told her she was fine.  She bled more after that so came here. Placental post per [place] .Marland KitchenShe reports good fetal movement, denies LOF, vaginal itching/burning, urinary symptoms, h/a, dizziness, n/v, diarrhea, constipation or fever/chills. .  Vaginal Bleeding The patient's primary symptoms include pelvic pain and vaginal bleeding. The patient's pertinent negatives include no genital itching, genital lesions or genital odor. This is a new problem. The current episode started today. The problem occurs intermittently. The pain is mild. She is pregnant. Associated symptoms include abdominal pain. Pertinent negatives include no chills, constipation, diarrhea, dysuria, fever, frequency, nausea or vomiting. The vaginal discharge was bloody. The vaginal bleeding is lighter than menses. She has not been passing clots. She has not been passing tissue. Nothing aggravates the symptoms. She has tried nothing for the symptoms. She is sexually active (several days ago).    RN Note: At 160while [facility] work [facility] [employer]. Was told to return to hosp if had more bleeding. Went out to car and had more VB so came here. Had some cramping here tonight while in [place] but that has gone now. Last intercourse about 4 days ago. Reports good FM  Past Medical History: Past Medical History:  Diagnosis Date  . Chlamydia 08/03/2020   Treated 08/03/20 PCO________  . History of enlarged tonsils   . Hypoglycemia     Past obstetric history: OB History  Gravida Para Term Preterm AB Living  1 0 0 0 0 0   SAB IAB Ectopic Multiple Live Births  0 0 0 0 0    # Outcome Date GA Lbr Len/2nd Weight Sex Delivery Anes PTL Lv  1 Current             Past Surgical History: Past Surgical History:  Procedure Laterality Date  . TONSILLECTOMY AND ADENOIDECTOMY N/A 02/02/2020   Procedure: TONSILLECTOMY AND ADENOIDECTOMY;  Surgeon: TLeta Baptist MD;  [place] [facility]  Service: E[place];  Laterality: N/A;    Family History: Family History  Problem Relation Age of Onset  . Heart attack Father   . Polycystic kidney disease Sister     Social History: Social History   Tobacco Use  . Smoking status: Current Every Day Smoker    Types: E-cigarettes  . Smokeless tobacco: Never Used  . Tobacco comment: Vapes   Vaping Use  . Vaping Use: Every day  . Substances: Nicotine, Flavoring  Substance Use Topics  . Alcohol use: Not Currently    Comment: Occasional  . Drug use: Never    Allergies:  Allergies  Allergen Reactions  . Shrimp (Diagnostic) Other (See Comments)    Tongue started burning and bumps developed on tongue.    Meds:  Medications Prior to Admission  Medication Sig Dispense Refill Last Dose  . amoxicillin-clavulanate (AUGMENTIN) 875-125 MG tablet Take 1 tablet by mouth 2 (two) times daily.   02/28/2021 at Unknown time  . Prenat w/o A-[id number]-[id number]-FA-DHA (CITRANATAL ASSURE) 35-1 & 300 MG tablet One tablet and one capsule daily 30 each 11 03/01/2021  at Unknown time  . Blood Glucose Monitoring Suppl (ACCU-CHEK GUIDE) w/Device KIT      . Ferrous Sulfate (IRON PO) Take 1 tablet by mouth every other day. (Patient not taking: Reported on 02/17/2021)     . glucose blood (PRECISION QID TEST) test strip To monitor blood sugar during episodes of hypoglycemia, max 5 times per week.       I have reviewed patient's Past Medical Hx, Surgical Hx, Family Hx, Social Hx, medications and allergies.   ROS:  Review of Systems  Constitutional: Negative for chills and fever.  Gastrointestinal:  Positive for abdominal pain. Negative for constipation, diarrhea, nausea and vomiting.  Genitourinary: Positive for pelvic pain and vaginal bleeding. Negative for dysuria and frequency.   Other systems negative  Physical Exam   Patient Vitals for the past 24 hrs:  BP Pulse SpO2 Height Weight  03/01/21 1947 111/72 (!) 113 99 % _0  (1.676 m) 65.8 kg   Constitutional: Well-developed, well-nourished female in no acute distress.  Cardiovascular: normal rate  Respiratory: normal effort GI: Abd soft, non-tender, gravid appropriate for gestational age.   No rebound or guarding. MS: Extremities nontender, no edema, normal ROM Neurologic: Alert and oriented x 4.  GU: Neg CVAT.  PELVIC EXAM: Cervix pink, visually closed, with friable lesion covering almost entire right half of surface of cervix, small red bleeding elicited when touched with culture swabs Bimanual exam: Cervix soft, closed, long.  neg CMT    FHT:  Baseline 135 , moderate variability, accelerations present, no decelerations Contractions:Uterine irritability with occasional contraction   Labs: Results for orders placed or performed during the hospital encounter of 03/01/21 (from the past 24 hour(s))  Urinalysis, Routine w reflex microscopic Urine, Clean Catch     Status: Abnormal   Collection Time: 03/01/21  7:55 PM  Result Value Ref Range   Color, Urine YELLOW YELLOW   APPearance CLOUDY (A) CLEAR   Specific Gravity, Urine 1.009 1.005 - 1.030   pH 6.0 5.0 - 8.0   Glucose, UA NEGATIVE NEGATIVE mg/dL   Hgb urine dipstick LARGE (A) NEGATIVE   Bilirubin Urine NEGATIVE NEGATIVE   Ketones, ur NEGATIVE NEGATIVE mg/dL   Protein, ur NEGATIVE NEGATIVE mg/dL   Nitrite NEGATIVE NEGATIVE   Leukocytes,Ua LARGE (A) NEGATIVE   RBC / HPF 0-5 0 - 5 RBC/hpf   WBC, UA >50 (H) 0 - 5 WBC/hpf   Bacteria, UA FEW (A) NONE SEEN   Squamous Epithelial / LPF 21-50 0 - 5   Mucus PRESENT   Wet prep, genital     Status: Abnormal   Collection  Time: 03/01/21  8:31 PM   Specimen: Vaginal  Result Value Ref Range   Yeast Wet Prep HPF POC NONE SEEN NONE SEEN   Trich, Wet Prep NONE SEEN NONE SEEN   Clue Cells Wet Prep HPF POC NONE SEEN NONE SEEN   WBC, Wet Prep HPF POC MANY (A) NONE SEEN   Sperm NONE SEEN     A/Positive/-- (11/24 1152)  Imaging:    MAU Course/MDM: I have ordered labs and reviewed results. UA:    GC/Wet prep sent NST reviewed Consult Dr Elly Modena with presentation, exam findings and test results. Recommends cauterizing with Monsel's solution.  Procardia series for irritabiliy and may discharge home if stable.  Treatments in MAU included Monsels to cervix, Procardia for cramping. BLeeding subsided as did her cramping. .    Assessment: Single IUP at 43w0dBleeding in third trimester Uterine irritability Friable cervix  Plan: Discharge home Preterm Labor precautions and fetal kick counts Bleeding precautions, warned about color of discharge post Monsels  Follow up in Office for prenatal visits and recheck of cervix  Encouraged to return if she develops worsening of symptoms, increase in pain, fever, or other concerning symptoms.   Pt stable at time of discharge.  Hansel Feinstein CNM, MSN Certified Nurse-Midwife 03/01/2021 8:14 PM

## 2021-03-01 NOTE — Telephone Encounter (Signed)
Patient called and wanted to inform the nurse that she started spotting. Phone call was transferred to [facility].

## 2021-03-01 NOTE — Discharge Instructions (Signed)
Vaginal Bleeding During Pregnancy, Third Trimester A small amount of bleeding, or spotting, from the vagina is common during early pregnancy. Sometimes the bleeding is normal and is not a problem, and sometimes it is a sign of something serious. In the third trimester, normal bleeding can happen:  Because of changes in your blood vessels.  When you have sex.  When you have pelvic exams. During this time, some abnormal things can cause bleeding. These include:  Infection in the womb.  Growths in the lowest part of the womb (cervix). These growths are also called polyps.  Problems of the placenta. The placenta may: ? Block the opening of the cervix. ? Break away from the womb. ? Grow into the muscle of the womb.  Early labor. Tell your doctor right away about any bleeding from your vagina. Follow these instructions at home: Watch your bleeding  Watch your condition for any changes. Let your doctor know if you are worried about something.  Try to know what causes your bleeding. Ask yourself these questions: ? Does the bleeding start on its own? ? Does the bleeding start after something is done, such as sex or a pelvic exam?  Use a diary to write the things you see about your bleeding. Write in your diary: ? If the bleeding flows freely without stopping, or if it starts and stops, and then starts again. ? If the bleeding is heavy or light. ? How many pads you use in a day and how much blood is in them.  Tell your doctor if you pass tissue. He or she may want to see it.   Activity  Follow your doctor's instructions about how active you can be. Your doctor may recommend that you: ? Stay in bed and only get up to use the bathroom. ? Continue light activity.  Ask your doctor if it is safe for you to drive.  Do not lift anything that is heavier than 10 lb (4.5 kg), or the limit that you are told.  Do not have sex until your doctor says that this is safe.  If needed, make plans  for someone to help you with your normal activities. Medicines  Take over-the-counter and prescription medicines only as told by your doctor.  Do not take aspirin. It can cause bleeding. General instructions  Do not use tampons.  Do not douche.  Keep all follow-up visits. Contact a doctor if:  You have vaginal bleeding at any time during pregnancy.  You have cramps.  You have a fever. Get help right away if:  You have very bad cramps.  You have very bad pain in your back or belly (abdomen).  You have a gush of fluid from your vagina.  Your bleeding gets worse.  You pass large clots or a lot of tissue from your vagina.  You feel light-headed or weak.  You pass out (faint).  Your baby is moving less than usual, or not moving at all. Summary  A small amount of bleeding is normal during pregnancy. Some bleeding may be caused by more serious problems.  Tell your doctor right away about any bleeding from your vagina.  Follow instructions from your doctor about how active you can be. You may need someone to help you with your normal activities. This information is not intended to replace advice given to you by your health care provider. Make sure you discuss any questions you have with your health care provider. Document Revised: 07/29/2020 Document Reviewed: 07/29/2020   Elsevier Patient Education  2021 [employer] Inc[employer] Preterm Labor The normal length of a pregnancy is 39-41 weeks. Preterm labor is when labor starts before 37 completed weeks of pregnancy. Babies who are born prematurely and survive may not be fully developed and may be at an increased risk for long-term problems such as cerebral palsy, developmental delays, and vision and hearing problems. Babies who are born too early may have problems soon after birth. Problems may include regulating blood sugar, body temperature, heart rate, and breathing rate. These babies often have trouble with feeding. The risk of having  problems is highest for babies who are born before 34 weeks of pregnancy. What are the causes? The exact cause of this condition is not known. What increases the risk? You are more likely to have preterm labor if you have certain risk factors that relate to your medical history, problems with present and past pregnancies, and lifestyle factors. Medical history  You have abnormalities of the uterus, including a short cervix.  You have STIs (sexually transmitted infections), or other infections of the urinary tract and the vagina.  You have chronic illnesses, such as blood clotting problems, diabetes, or high blood pressure.  You are overweight or underweight. Present and past pregnancies  You have had preterm labor before.  You are pregnant with twins or other multiples.  You have been diagnosed with a condition in which the placenta covers your cervix (placenta previa).  You waited less than 6 months between giving birth and becoming pregnant again.  Your unborn baby has some abnormalities.  You have vaginal bleeding during pregnancy.  You became pregnant through in vitro fertilization (IVF). Lifestyle and environmental factors  You use tobacco products.  You drink alcohol.  You use street drugs.  You have stress and no social support.  You experience domestic violence.  You are exposed to certain chemicals or environmental pollutants. Other factors  You are younger than age 68 or older than age 23. What are the signs or symptoms? Symptoms of this condition include:  Cramps similar to those that can happen during a menstrual period. The cramps may happen with diarrhea.  Pain in the abdomen or lower back.  Regular contractions that may feel like tightening of the abdomen.  A feeling of increased pressure in the pelvis.  Increased watery or bloody mucus discharge from the vagina.  Water breaking (ruptured amniotic sac). How is this diagnosed? This condition  is diagnosed based on:  Your medical history and a physical exam.  A pelvic exam.  An ultrasound.  Monitoring your uterus for contractions.  Other tests, including: ? A swab of the cervix to check for a chemical called fetal fibronectin. ? Urine tests. How is this treated? Treatment for this condition depends on the length of your pregnancy, your condition, and the health of your baby. Treatment may include:  Taking medicines, such as: ? Hormone medicines. These may be given early in pregnancy to help support the pregnancy. ? Medicines to stop contractions. ? Medicines to help mature the baby's lungs. These may be prescribed if the risk of delivery is high. ? Medicines to prevent your baby from developing cerebral palsy.  Bed rest. If the labor happens before 34 weeks of pregnancy, you may need to stay in the hospital.  Delivery of the baby. Follow these instructions at home:  Do not use any products that contain nicotine or tobacco, such as cigarettes, e-cigarettes, and chewing tobacco. If you need help quitting, ask your  health care provider.  Do not drink alcohol.  Take over-the-counter and prescription medicines only as told by your health care provider.  Rest as told by your health care provider.  Return to your normal activities as told by your health care provider. Ask your health care provider what activities are safe for you.  Keep all follow-up visits as told by your health care provider. This is important.   How is this prevented? To increase your chance of having a full-term pregnancy:  Do not use street drugs or medicines that have not been prescribed to you during your pregnancy.  Talk with your health care provider before taking any herbal supplements, even if you have been taking them regularly.  Make sure you gain a healthy amount of weight during your pregnancy.  Watch for infection. If you think that you might have an infection, get it checked right  away. Symptoms of infection may include: ? Fever. ? Abnormal vaginal discharge or discharge that smells bad. ? Pain or burning with urination. ? Needing to urinate urgently. ? Frequently urinating or passing small amounts of urine frequently. ? Blood in your urine. ? Urine that smells bad or unusual.  Tell your health care provider if you have had preterm labor before. Contact a health care provider if:  You think you are going into preterm labor.  You have signs or symptoms of preterm labor.  You have symptoms of infection. Get help right away if:  You are having regular, painful contractions every 5 minutes or less.  Your water breaks. Summary  Preterm labor is labor that starts before you reach 37 weeks of pregnancy.  Delivering your baby early increases your baby's risk of developing lifelong problems.  The exact cause of preterm labor is unknown. However, having an abnormal uterus, an STI (sexually transmitted infection), or vaginal bleeding during pregnancy increases your risk for preterm labor.  Keep all follow-up visits as told by your health care provider. This is important.  Contact a health care provider if you have signs or symptoms of preterm labor. This information is not intended to replace advice given to you by your health care provider. Make sure you discuss any questions you have with your health care provider. Document Revised: 12/09/2019 Document Reviewed: 12/09/2019 Elsevier Patient Education  2021 [employer].

## 2021-03-01 NOTE — MAU Note (Addendum)
At 1545 while I work went to [facility] and saw bright blood on panties. Went to [facility] and was told baby is fine after being on EFM. Was told to return to hosp if had more bleeding. Went out to car and had more VB so came here. Had some cramping here tonight while in [place] but that has gone now. Last intercourse about 4 days ago. Reports good FM

## 2021-03-01 NOTE — Telephone Encounter (Signed)
Patient states she went to the bathroom earlier and noted her underwear to be saturated with blood.  Advised patient to go to [facility] for evaluation.  Verbalized understanding and agreeable to do so.

## 2021-03-02 LAB — GC/CHLAMYDIA PROBE AMP (~~LOC~~) NOT AT ARMC
Chlamydia: NEGATIVE
Comment: NEGATIVE
Comment: NORMAL
Neisseria Gonorrhea: NEGATIVE

## 2021-03-03 ENCOUNTER — Other Ambulatory Visit: Payer: Self-pay

## 2021-03-03 ENCOUNTER — Encounter: Payer: Self-pay | Admitting: Women's Health

## 2021-03-03 ENCOUNTER — Ambulatory Visit (INDEPENDENT_AMBULATORY_CARE_PROVIDER_SITE_OTHER): Payer: Medicaid Other | Admitting: Women's Health

## 2021-03-03 VITALS — BP 108/65 | HR 97 | Wt 144.6 lb

## 2021-03-03 DIAGNOSIS — Z3403 Encounter for supervision of normal first pregnancy, third trimester: Secondary | ICD-10-CM

## 2021-03-03 LAB — CULTURE, OB URINE: Culture: <10000 — AB

## 2021-03-03 NOTE — Progress Notes (Signed)
   LOW-RISK PREGNANCY VISIT Patient name: Elizabeth Larsen MRN [id number]  Date of birth: [birth year] Chief Complaint:   Routine Prenatal Visit  History of Present Illness:   Elizabeth Larsen is a 20 y.o. G61P0000 female at [redacted]w[redacted]d with an Estimated Date of Delivery: 04/27/21 being seen today for ongoing management of a low-risk pregnancy.  Depression screen [facility] 2/9 01/27/2021 10/13/2020 09/07/2020 07/23/2020 07/06/2020  Decreased Interest 0 0 0 0 0  Down, Depressed, Hopeless 0 0 0 0 0  PHQ - 2 Score 0 0 0 0 0  Altered sleeping 2 0 1 0 2  Tired, decreased energy 1 0 0 0 0  Change in appetite 0 0 0 0 0  Feeling bad or failure about yourself  0 0 0 0 0  Trouble concentrating 0 0 0 0 0  Moving slowly or fidgety/restless 0 0 0 0 1  Suicidal thoughts 0 0 0 0 0  PHQ-9 Score 3 0 1 0 3  Difficult doing work/chores - - - - Not difficult at all    Today she reports no complaints. Went to [facility] for vag bleeding, monsels applied to cx, no bleeding since, wet prep & urine cx neg. Contractions: Irritability. Vag. Bleeding: None.  Movement: Present. denies leaking of fluid. Review of Systems:   Pertinent items are noted in HPI Denies abnormal vaginal discharge w/ itching/odor/irritation, headaches, visual changes, shortness of breath, chest pain, abdominal pain, severe nausea/vomiting, or problems with urination or bowel movements unless otherwise stated above. Pertinent History Reviewed:  Reviewed past medical,surgical, social, obstetrical and family history.  Reviewed problem list, medications and allergies. Physical Assessment:   Vitals:   03/03/21 1439  BP: 108/65  Pulse: 97  Weight: 144 lb 9.6 oz (65.6 kg)  Body mass index is 23.34 kg/m.        Physical Examination:   General appearance: Well appearing, and in no distress  Mental status: Alert, oriented to person, place, and time  Skin: Warm & dry  Cardiovascular: Normal heart rate noted  Respiratory: Normal respiratory effort, no  distress  Abdomen: Soft, gravid, nontender  Pelvic: Cervical exam deferred         Extremities: Edema: Trace  Fetal Status: Fetal Heart Rate (bpm): 140 Fundal Height: 30 cm Movement: Present    Chaperone: N/A   No results found for this or any previous visit (from the past 24 hour(s)).  Assessment & Plan:  1) Low-risk pregnancy G1P0000 at [redacted]w[redacted]d with an Estimated Date of Delivery: 04/27/21    Meds: No orders of the defined types were placed in this encounter.  Labs/procedures today: none  Plan:  Continue routine obstetrical care  Next visit: prefers in person    Reviewed: Preterm labor symptoms and general obstetric precautions including but not limited to vaginal bleeding, contractions, leaking of fluid and fetal movement were reviewed in detail with the patient.  All questions were answered. Does have home bp cuff. Office bp cuff given: not applicable. Check bp weekly, let us know if consistently >140 and/or >90.  Follow-up: Return in about 2 weeks (around 03/17/2021) for LROB, CNM, in person.  No future appointments.  No orders of the defined types were placed in this encounter.  Cheral Marker CNM, [facility] 03/03/2021 3:03 PM

## 2021-03-03 NOTE — Patient Instructions (Signed)
Elizabeth Larsen, I greatly value your feedback.  If you receive a survey following your visit with [place] today, we appreciate you taking the time to fill it out.  Thanks, [name], CNM, WHNP-BC  [facility] ([address] [place], [place] [address]) Entrance C, located off of [address] valet parking   Go to [email or web address] to register for FREE online childbirth classes    Call the office [phone]) or go to [facility] if:  You begin to have strong, frequent contractions  Your water breaks.  Sometimes it is a big gush of fluid, sometimes it is just a trickle that keeps getting your panties wet or running down your legs  You have vaginal bleeding.  It is normal to have a small amount of spotting if your cervix was checked.   You don't feel your baby moving like normal.  If you don't, get you something to eat and drink and lay down and focus on feeling your baby move.  You should feel at least 10 movements in 2 hours.  If you don't, you should call the office or go to [facility].   Call the office [phone]) or go to [facility] hospital for these signs of pre-eclampsia:  Severe headache that does not go away with Tylenol  Visual changes- seeing spots, double, blurred vision  Pain under your right breast or upper abdomen that does not go away with Tums or heartburn medicine  Nausea and/or vomiting  Severe swelling in your hands, feet, and face    Home Blood Pressure Monitoring for Patients   Your provider has recommended that you check your blood pressure (BP) at least once a week at home. If you do not have a blood pressure cuff at home, one will be provided for you. Contact your provider if you have not received your monitor within 1 week.   Helpful Tips for Accurate Home Blood Pressure Checks  . Don't smoke, exercise, or drink caffeine 30 minutes before checking your BP . Use the restroom before checking your BP (a full  bladder can raise your pressure) . Relax in a comfortable upright chair . Feet on the ground . Left arm resting comfortably on a flat surface at the level of your heart . Legs uncrossed . Back supported . Sit quietly and don't talk . Place the cuff on your bare arm . Adjust snuggly, so that only two fingertips can fit between your skin and the top of the cuff . Check 2 readings separated by at least one minute . Keep a log of your BP readings . For a visual, please reference this diagram: http://ccnc.care/bpdiagram  Provider Name: [facility]     Phone: [phone]  Zone 1: ALL CLEAR  Continue to monitor your symptoms:  . BP reading is less than 140 (top number) or less than 90 (bottom number)  . No right upper stomach pain . No headaches or seeing spots . No feeling nauseated or throwing up . No swelling in face and hands  Zone 2: CAUTION Call your doctor's office for any of the following:  . BP reading is greater than 140 (top number) or greater than 90 (bottom number)  . Stomach pain under your ribs in the middle or right side . Headaches or seeing spots . Feeling nauseated or throwing up . Swelling in face and hands  Zone 3: EMERGENCY  Seek immediate medical care if you have any of  the following:  . BP reading is greater than160 (top number) or greater than 110 (bottom number) . Severe headaches not improving with Tylenol . Serious difficulty catching your breath . Any worsening symptoms from Zone 2  Preterm Labor and Birth Information  The normal length of a pregnancy is 39-41 weeks. Preterm labor is when labor starts before 37 completed weeks of pregnancy. What are the risk factors for preterm labor? Preterm labor is more likely to occur in women who:  Have certain infections during pregnancy such as a bladder infection, sexually transmitted infection, or infection inside the uterus (chorioamnionitis).  Have a shorter-than-normal cervix.  Have gone into  preterm labor before.  Have had surgery on their cervix.  Are younger than age 22 or older than age 25.  Are African American.  Are pregnant with twins or multiple babies (multiple gestation).  Take street drugs or smoke while pregnant.  Do not gain enough weight while pregnant.  Became pregnant shortly after having been pregnant. What are the symptoms of preterm labor? Symptoms of preterm labor include:  Cramps similar to those that can happen during a menstrual period. The cramps may happen with diarrhea.  Pain in the abdomen or lower back.  Regular uterine contractions that may feel like tightening of the abdomen.  A feeling of increased pressure in the pelvis.  Increased watery or bloody mucus discharge from the vagina.  Water breaking (ruptured amniotic sac). Why is it important to recognize signs of preterm labor? It is important to recognize signs of preterm labor because babies who are born prematurely may not be fully developed. This can put them at an increased risk for:  Long-term (chronic) heart and lung problems.  Difficulty immediately after birth with regulating body systems, including blood sugar, body temperature, heart rate, and breathing rate.  Bleeding in the brain.  Cerebral palsy.  Learning difficulties.  Death. These risks are highest for babies who are born before 48 weeks of pregnancy. How is preterm labor treated? Treatment depends on the length of your pregnancy, your condition, and the health of your baby. It may involve: 1. Having a stitch (suture) placed in your cervix to prevent your cervix from opening too early (cerclage). 2. Taking or being given medicines, such as: ? Hormone medicines. These may be given early in pregnancy to help support the pregnancy. ? Medicine to stop contractions. ? Medicines to help mature the baby's lungs. These may be prescribed if the risk of delivery is high. ? Medicines to prevent your baby from  developing cerebral palsy. If the labor happens before 34 weeks of pregnancy, you may need to stay in the hospital. What should I do if I think I am in preterm labor? If you think that you are going into preterm labor, call your health care provider right away. How can I prevent preterm labor in future pregnancies? To increase your chance of having a full-term pregnancy:  Do not use any tobacco products, such as cigarettes, chewing tobacco, and e-cigarettes. If you need help quitting, ask your health care provider.  Do not use street drugs or medicines that have not been prescribed to you during your pregnancy.  Talk with your health care provider before taking any herbal supplements, even if you have been taking them regularly.  Make sure you gain a healthy amount of weight during your pregnancy.  Watch for infection. If you think that you might have an infection, get it checked right away.  Make sure to  tell your health care provider if you have gone into preterm labor before. This information is not intended to replace advice given to you by your health care provider. Make sure you discuss any questions you have with your health care provider. Document Revised: 02/28/2019 Document Reviewed: 03/29/2016 Elsevier Patient Education  [employer].

## 2021-03-14 ENCOUNTER — Encounter: Payer: Self-pay | Admitting: Internal Medicine

## 2021-03-14 DIAGNOSIS — E162 Hypoglycemia, unspecified: Secondary | ICD-10-CM | POA: Diagnosis not present

## 2021-03-14 NOTE — Telephone Encounter (Signed)
Called pt to find out exactly what she needs. First she stated she needs height and weight for her job. Last seen 07/2020. Informed pt if her job requires a physical and paperwork to be completed, she will need to schedule an appt and bring the form with her. Stated she will find out and call back.

## 2021-03-17 ENCOUNTER — Encounter: Payer: Self-pay | Admitting: Women's Health

## 2021-03-17 ENCOUNTER — Ambulatory Visit (INDEPENDENT_AMBULATORY_CARE_PROVIDER_SITE_OTHER): Payer: Medicaid Other | Admitting: Women's Health

## 2021-03-17 ENCOUNTER — Other Ambulatory Visit: Payer: Self-pay

## 2021-03-17 VITALS — BP 115/72 | HR 85 | Wt 149.5 lb

## 2021-03-17 DIAGNOSIS — Z3403 Encounter for supervision of normal first pregnancy, third trimester: Secondary | ICD-10-CM

## 2021-03-17 NOTE — Patient Instructions (Signed)
Elizabeth Larsen, I greatly value your feedback.  If you receive a survey following your visit with us today, we appreciate you taking the time to fill it out.  Thanks, Kim Calynn Ferrero, CNM, WHNP-BC  [facility] ([address] [place], [place] [address]) Entrance C, located off of [address] Free 24/7 valet parking   Go to [email or web address] to register for FREE online childbirth classes    Call the office ([phone]) or go to [facility] if:  You begin to have strong, frequent contractions  Your water breaks.  Sometimes it is a big gush of fluid, sometimes it is just a trickle that keeps getting your panties wet or running down your legs  You have vaginal bleeding.  It is normal to have a small amount of spotting if your cervix was checked.   You don't feel your baby moving like normal.  If you don't, get you something to eat and drink and lay down and focus on feeling your baby move.  You should feel at least 10 movements in 2 hours.  If you don't, you should call the office or go to [facility].   Call the office ([phone]) or go to [facility] for these signs of pre-eclampsia:  Severe headache that does not go away with Tylenol  Visual changes- seeing spots, double, blurred vision  Pain under your right breast or upper abdomen that does not go away with Tums or heartburn medicine  Nausea and/or vomiting  Severe swelling in your hands, feet, and face    Home Blood Pressure Monitoring for Patients   Your provider has recommended that you check your blood pressure (BP) at least once a week at home. If you do not have a blood pressure cuff at home, one will be provided for you. Contact your provider if you have not received your monitor within 1 week.   Helpful Tips for Accurate Home Blood Pressure Checks  . Don't smoke, exercise, or drink caffeine 30 minutes before checking your BP . Use the restroom before checking your BP (a full  bladder can raise your pressure) . Relax in a comfortable upright chair . Feet on the ground . Left arm resting comfortably on a flat surface at the level of your heart . Legs uncrossed . Back supported . Sit quietly and don't talk . Place the cuff on your bare arm . Adjust snuggly, so that only two fingertips can fit between your skin and the top of the cuff . Check 2 readings separated by at least one minute . Keep a log of your BP readings . For a visual, please reference this diagram: http://ccnc.care/bpdiagram  Provider Name: [facility]     Phone: [phone]  Zone 1: ALL CLEAR  Continue to monitor your symptoms:  . BP reading is less than 140 (top number) or less than 90 (bottom number)  . No right upper stomach pain . No headaches or seeing spots . No feeling nauseated or throwing up . No swelling in face and hands  Zone 2: CAUTION Call your doctor's office for any of the following:  . BP reading is greater than 140 (top number) or greater than 90 (bottom number)  . Stomach pain under your ribs in the middle or right side . Headaches or seeing spots . Feeling nauseated or throwing up . Swelling in face and hands  Zone 3: EMERGENCY  Seek immediate medical care if you have any of   the following:  . BP reading is greater than160 (top number) or greater than 110 (bottom number) . Severe headaches not improving with Tylenol . Serious difficulty catching your breath . Any worsening symptoms from Zone 2  Preterm Labor and Birth Information  The normal length of a pregnancy is 39-41 weeks. Preterm labor is when labor starts before 37 completed weeks of pregnancy. What are the risk factors for preterm labor? Preterm labor is more likely to occur in women who:  Have certain infections during pregnancy such as a bladder infection, sexually transmitted infection, or infection inside the uterus (chorioamnionitis).  Have a shorter-than-normal cervix.  Have gone into  preterm labor before.  Have had surgery on their cervix.  Are younger than age 54 or older than age 25.  Are African American.  Are pregnant with twins or multiple babies (multiple gestation).  Take street drugs or smoke while pregnant.  Do not gain enough weight while pregnant.  Became pregnant shortly after having been pregnant. What are the symptoms of preterm labor? Symptoms of preterm labor include:  Cramps similar to those that can happen during a menstrual period. The cramps may happen with diarrhea.  Pain in the abdomen or lower back.  Regular uterine contractions that may feel like tightening of the abdomen.  A feeling of increased pressure in the pelvis.  Increased watery or bloody mucus discharge from the vagina.  Water breaking (ruptured amniotic sac). Why is it important to recognize signs of preterm labor? It is important to recognize signs of preterm labor because babies who are born prematurely may not be fully developed. This can put them at an increased risk for:  Long-term (chronic) heart and lung problems.  Difficulty immediately after birth with regulating body systems, including blood sugar, body temperature, heart rate, and breathing rate.  Bleeding in the brain.  Cerebral palsy.  Learning difficulties.  Death. These risks are highest for babies who are born before 48 weeks of pregnancy. How is preterm labor treated? Treatment depends on the length of your pregnancy, your condition, and the health of your baby. It may involve: 1. Having a stitch (suture) placed in your cervix to prevent your cervix from opening too early (cerclage). 2. Taking or being given medicines, such as: ? Hormone medicines. These may be given early in pregnancy to help support the pregnancy. ? Medicine to stop contractions. ? Medicines to help mature the baby's lungs. These may be prescribed if the risk of delivery is high. ? Medicines to prevent your baby from  developing cerebral palsy. If the labor happens before 34 weeks of pregnancy, you may need to stay in the hospital. What should I do if I think I am in preterm labor? If you think that you are going into preterm labor, call your health care provider right away. How can I prevent preterm labor in future pregnancies? To increase your chance of having a full-term pregnancy:  Do not use any tobacco products, such as cigarettes, chewing tobacco, and e-cigarettes. If you need help quitting, ask your health care provider.  Do not use street drugs or medicines that have not been prescribed to you during your pregnancy.  Talk with your health care provider before taking any herbal supplements, even if you have been taking them regularly.  Make sure you gain a healthy amount of weight during your pregnancy.  Watch for infection. If you think that you might have an infection, get it checked right away.  Make sure to  tell your health care provider if you have gone into preterm labor before. This information is not intended to replace advice given to you by your health care provider. Make sure you discuss any questions you have with your health care provider. Document Revised: 02/28/2019 Document Reviewed: 03/29/2016 Elsevier Patient Education  [place].

## 2021-03-17 NOTE — Progress Notes (Signed)
   LOW-RISK PREGNANCY VISIT Patient name: Elizabeth Larsen MRN [id number]  Date of birth: 14-Dec-2000 Chief Complaint:   Routine Prenatal Visit  History of Present Illness:   Elizabeth Larsen is a 20 y.o. G80P0000 female at [redacted]w[redacted]d with an Estimated Date of Delivery: 04/27/21 being seen today for ongoing management of a low-risk pregnancy.  Depression screen [facility] 2/9 01/27/2021 10/13/2020 09/07/2020 07/23/2020 07/06/2020  Decreased Interest 0 0 0 0 0  Down, Depressed, Hopeless 0 0 0 0 0  PHQ - 2 Score 0 0 0 0 0  Altered sleeping 2 0 1 0 2  Tired, decreased energy 1 0 0 0 0  Change in appetite 0 0 0 0 0  Feeling bad or failure about yourself  0 0 0 0 0  Trouble concentrating 0 0 0 0 0  Moving slowly or fidgety/restless 0 0 0 0 1  Suicidal thoughts 0 0 0 0 0  PHQ-9 Score 3 0 1 0 3  Difficult doing work/chores - - - - Not difficult at all    Today she reports occ sharp pain Rt side/flank, lasts for hours. No n/v/fever/chills, etc. No uti sx. Contractions: Irregular. Vag. Bleeding: None.  Movement: Present. denies leaking of fluid. Review of Systems:   Pertinent items are noted in HPI Denies abnormal vaginal discharge w/ itching/odor/irritation, headaches, visual changes, shortness of breath, chest pain, abdominal pain, severe nausea/vomiting, or problems with urination or bowel movements unless otherwise stated above. Pertinent History Reviewed:  Reviewed past medical,surgical, social, obstetrical and family history.  Reviewed problem list, medications and allergies. Physical Assessment:   Vitals:   03/17/21 1536  BP: 115/72  Pulse: 85  Weight: 149 lb 8 oz (67.8 kg)  Body mass index is 24.13 kg/m.        Physical Examination:   General appearance: Well appearing, and in no distress  Mental status: Alert, oriented to person, place, and time  Skin: Warm & dry  Cardiovascular: Normal heart rate noted  Respiratory: Normal respiratory effort, no distress  Abdomen: Soft, gravid,  nontender  Back: no CVAT  Pelvic: Cervical exam deferred         Extremities: Edema: Trace  Fetal Status: Fetal Heart Rate (bpm): 153 Fundal Height: 32 cm Movement: Present    Chaperone: N/A   No results found for this or any previous visit (from the past 24 hour(s)).  Assessment & Plan:  1) Low-risk pregnancy G1P0000 at [redacted]w[redacted]d with an Estimated Date of Delivery: 04/27/21   2) Occ Rt side/flank pain, no CVAT/uti sx/fever/chills/n/v. Can try apap/ice/heat, etc.   Meds: No orders of the defined types were placed in this encounter.  Labs/procedures today: none  Plan:  Continue routine obstetrical care  Next visit: prefers will be in person for cultures    Reviewed: Preterm labor symptoms and general obstetric precautions including but not limited to vaginal bleeding, contractions, leaking of fluid and fetal movement were reviewed in detail with the patient.  All questions were answered.  Follow-up: Return in about 2 weeks (around 03/31/2021) for LROB, CNM, in person.  No future appointments.  No orders of the defined types were placed in this encounter.  Cheral Marker CNM, [facility] 03/17/2021 4:20 PM

## 2021-03-23 ENCOUNTER — Telehealth: Payer: Self-pay | Admitting: *Deleted

## 2021-03-23 NOTE — Telephone Encounter (Signed)
Patient states she is still having flank pain. Has not tried heat or ice to area as recommended.  Denies UTI symptoms, bleeding. Advised to try heat to area for max 20 minutes and take some Tylenol.  If pain continues to worsen despite these recommendations or notices bleeding or UTI symptoms, to let us know. Pt verbalized understanding.

## 2021-03-23 NOTE — Telephone Encounter (Signed)
LMOVM to return our call in response to her mychart message.

## 2021-03-28 ENCOUNTER — Other Ambulatory Visit: Payer: Self-pay

## 2021-03-28 ENCOUNTER — Inpatient Hospital Stay (HOSPITAL_COMMUNITY)
Admission: AD | Admit: 2021-03-28 | Discharge: 2021-03-29 | Disposition: A | Discharge status: Home / Self Care | Payer: Medicaid Other | Attending: Family Medicine | Admitting: Family Medicine

## 2021-03-28 DIAGNOSIS — F17290 Nicotine dependence, other tobacco product, uncomplicated: Secondary | ICD-10-CM | POA: Insufficient documentation

## 2021-03-28 DIAGNOSIS — O99333 Smoking (tobacco) complicating pregnancy, third trimester: Secondary | ICD-10-CM | POA: Insufficient documentation

## 2021-03-28 DIAGNOSIS — Z3493 Encounter for supervision of normal pregnancy, unspecified, third trimester: Secondary | ICD-10-CM

## 2021-03-28 DIAGNOSIS — O99891 Other specified diseases and conditions complicating pregnancy: Secondary | ICD-10-CM | POA: Insufficient documentation

## 2021-03-28 DIAGNOSIS — Z3689 Encounter for other specified antenatal screening: Secondary | ICD-10-CM

## 2021-03-28 DIAGNOSIS — Z0371 Encounter for suspected problem with amniotic cavity and membrane ruled out: Secondary | ICD-10-CM | POA: Insufficient documentation

## 2021-03-28 DIAGNOSIS — M549 Dorsalgia, unspecified: Secondary | ICD-10-CM | POA: Insufficient documentation

## 2021-03-28 DIAGNOSIS — Z3A35 35 weeks gestation of pregnancy: Secondary | ICD-10-CM | POA: Insufficient documentation

## 2021-03-29 ENCOUNTER — Encounter (HOSPITAL_COMMUNITY): Payer: Self-pay | Admitting: Family Medicine

## 2021-03-29 DIAGNOSIS — O99891 Other specified diseases and conditions complicating pregnancy: Secondary | ICD-10-CM | POA: Diagnosis not present

## 2021-03-29 DIAGNOSIS — O26893 Other specified pregnancy related conditions, third trimester: Secondary | ICD-10-CM | POA: Diagnosis present

## 2021-03-29 DIAGNOSIS — M549 Dorsalgia, unspecified: Secondary | ICD-10-CM | POA: Diagnosis not present

## 2021-03-29 DIAGNOSIS — O99333 Smoking (tobacco) complicating pregnancy, third trimester: Secondary | ICD-10-CM | POA: Diagnosis not present

## 2021-03-29 DIAGNOSIS — Z3689 Encounter for other specified antenatal screening: Secondary | ICD-10-CM | POA: Diagnosis not present

## 2021-03-29 DIAGNOSIS — F17290 Nicotine dependence, other tobacco product, uncomplicated: Secondary | ICD-10-CM | POA: Diagnosis not present

## 2021-03-29 DIAGNOSIS — Z3A35 35 weeks gestation of pregnancy: Secondary | ICD-10-CM

## 2021-03-29 DIAGNOSIS — Z0371 Encounter for suspected problem with amniotic cavity and membrane ruled out: Secondary | ICD-10-CM | POA: Diagnosis not present

## 2021-03-29 LAB — GC/CHLAMYDIA PROBE AMP (~~LOC~~) NOT AT ARMC
Chlamydia: NEGATIVE
Comment: NEGATIVE
Comment: NORMAL
Neisseria Gonorrhea: NEGATIVE

## 2021-03-29 LAB — WET PREP, GENITAL
Clue Cells Wet Prep HPF POC: NONE SEEN
Sperm: NONE SEEN
Trich, Wet Prep: NONE SEEN
Yeast Wet Prep HPF POC: NONE SEEN

## 2021-03-29 LAB — AMNISURE RUPTURE OF MEMBRANE (ROM) NOT AT ARMC: Amnisure ROM: NEGATIVE

## 2021-03-29 LAB — OB RESULTS CONSOLE GC/CHLAMYDIA: Gonorrhea: NEGATIVE

## 2021-03-29 MED ORDER — ACETAMINOPHEN 500 MG PO TABS
1000.0000 mg | ORAL_TABLET | Freq: Once | ORAL | Status: AC
  Administered 2021-03-29: 1000 mg via ORAL
  Filled 2021-03-29: qty 2

## 2021-03-29 MED ORDER — CYCLOBENZAPRINE HCL 5 MG PO TABS
5.0000 mg | ORAL_TABLET | Freq: Once | ORAL | Status: AC
  Administered 2021-03-29: 5 mg via ORAL
  Filled 2021-03-29: qty 1

## 2021-03-29 MED ORDER — CYCLOBENZAPRINE HCL 5 MG PO TABS: 5.0000 mg | ORAL_TABLET | Freq: Every day | ORAL | 0 refills | Status: DC

## 2021-03-29 NOTE — Discharge Instructions (Signed)
Back Pain in Pregnancy Back pain during pregnancy is common. Back pain may be caused by several factors that are related to changes during your pregnancy. Follow these instructions at home: Managing pain, stiffness, and swelling  If directed, for sudden (acute) back pain, put ice on the painful area. ? Put ice in a plastic bag. ? Place a towel between your skin and the bag. ? Leave the ice on for 20 minutes, 2-3 times per day.  If directed, apply heat to the affected area before you exercise. Use the heat source that your health care provider recommends, such as a moist heat pack or a heating pad. ? Place a towel between your skin and the heat source. ? Leave the heat on for 20-30 minutes. ? Remove the heat if your skin turns bright red. This is especially important if you are unable to feel pain, heat, or cold. You may have a greater risk of getting burned.  If directed, massage the affected area.      Activity  Exercise as told by your health care provider. Gentle exercise is the best way to prevent or manage back pain.  Listen to your body when lifting. If lifting hurts, ask for help or bend your knees. This uses your leg muscles instead of your back muscles.  Squat down when picking up something from the floor. Do not bend over.  Only use bed rest for short periods as told by your health care provider. Bed rest should only be used for the most severe episodes of back pain. Standing, sitting, and lying down  Do not stand in one place for long periods of time.  Use good posture when sitting. Make sure your head rests over your shoulders and is not hanging forward. Use a pillow on your lower back if necessary.  Try sleeping on your side, preferably the left side, with a pregnancy support pillow or 1-2 regular pillows between your legs. ? If you have back pain after a night's rest, your bed may be too soft. ? A firm mattress may provide more support for your back during  pregnancy. General instructions  Do not wear high heels.  Eat a healthy diet. Try to gain weight within your health care provider's recommendations.  Use a maternity girdle, elastic sling, or back brace as told by your health care provider.  Take over-the-counter and prescription medicines only as told by your health care provider.  Work with a physical therapist or massage therapist to find ways to manage back pain. Acupuncture or massage therapy may be helpful.  Keep all follow-up visits as told by your health care provider. This is important. Contact a health care provider if:  Your back pain interferes with your daily activities.  You have increasing pain in other parts of your body. Get help right away if:  You develop numbness, tingling, weakness, or problems with the use of your arms or legs.  You develop severe back pain that is not controlled with medicine.  You have a change in bowel or bladder control.  You develop shortness of breath, dizziness, or you faint.  You develop nausea, vomiting, or sweating.  You have back pain that is a rhythmic, cramping pain similar to labor pains. Labor pain is usually 1-2 minutes apart, lasts for about 1 minute, and involves a bearing down feeling or pressure in your pelvis.  You have back pain and your water breaks or you have vaginal bleeding.  You have back pain or   numbness that travels down your leg.  Your back pain developed after you fell.  You develop pain on one side of your back.  You see blood in your urine.  You develop skin blisters in the area of your back pain. Summary  Back pain may be caused by several factors that are related to changes during your pregnancy.  Follow instructions as told by your health care provider for managing pain, stiffness, and swelling.  Exercise as told by your health care provider. Gentle exercise is the best way to prevent or manage back pain.  Take over-the-counter and  prescription medicines only as told by your health care provider.  Keep all follow-up visits as told by your health care provider. This is important. This information is not intended to replace advice given to you by your health care provider. Make sure you discuss any questions you have with your health care provider. Document Revised: 02/25/2019 Document Reviewed: 04/24/2018 Elsevier Patient Education  2021 Elsevier Inc.  

## 2021-03-29 NOTE — MAU Note (Signed)
PT SAYS SHE HAS HAD BACK PAIN ALL PREG.  [facility] WITH [facility] TODAY- CALLED OFFICE  AT 9 PM-  NO MEDS FOR PAIN

## 2021-03-29 NOTE — MAU Provider Note (Signed)
Chief Complaint:  Back Pain   Event Date/Time   First Provider Initiated Contact with Patient 03/29/21 0205     HPI: Elizabeth Larsen is a 20 y.o. G1P0000 at 43w6dwho presents to maternity admissions reporting upper left sided pinpoint back pain. This is not a new problem, it has been going on for the majority of her pregnancy. Sat down in her work chair today and her back started to hurt and got worse throughout the day. Has not taken anything for the pain today. No associated symptoms like n/v, SOB or chest pain. Also reports one episode of increased vaginal discharge today, thinks it looked like mucus but wants "a test to make sure my water didn't break." Did not have any additional episodes of LOF. Denies vaginal bleeding, continued leaking of fluid, decreased fetal movement, fever, falls, or recent illness.   Pregnancy Course: Receives care at [facility]  Past Medical History:  Diagnosis Date  . Chlamydia 08/03/2020   Treated 08/03/20 PCO________  . History of enlarged tonsils   . Hypoglycemia    OB History  Gravida Para Term Preterm AB Living  1 0 0 0 0 0  SAB IAB Ectopic Multiple Live Births  0 0 0 0 0    # Outcome Date GA Lbr Len/2nd Weight Sex Delivery Anes PTL Lv  1 Current            Past Surgical History:  Procedure Laterality Date  . TONSILLECTOMY AND ADENOIDECTOMY N/A 02/02/2020   Procedure: TONSILLECTOMY AND ADENOIDECTOMY;  Surgeon: TLeta Baptist MD;  [facility]  Service: ENT;  Laterality: N/A;   Family History  Problem Relation Age of Onset  . Heart attack Father   . Polycystic kidney disease Sister    Social History   Tobacco Use  . Smoking status: Current Every Day Smoker    Types: E-cigarettes  . Smokeless tobacco: Never Used  . Tobacco comment: Vapes   Vaping Use  . Vaping Use: Every day  . Substances: Nicotine, Flavoring  Substance Use Topics  . Alcohol use: Not Currently    Comment: Occasional  . Drug use: Never   Allergies   Allergen Reactions  . Shellfish Allergy Other (See Comments)    Tongue started burning and bumps developed on tongue.  . Shrimp (Diagnostic) Other (See Comments)    Tongue started burning and bumps developed on tongue.   No medications prior to admission.    I have reviewed patient's Past Medical Hx, Surgical Hx, Family Hx, Social Hx, medications and allergies.   ROS:  Review of Systems  Constitutional: Negative for fatigue and fever.  HENT: Negative for congestion and sore throat.   Eyes: Negative for visual disturbance.  Respiratory: Negative for cough and shortness of breath.   Cardiovascular: Negative for chest pain.  Gastrointestinal: Negative for nausea and vomiting.  Genitourinary: Positive for vaginal discharge (increased "normal" discharge). Negative for dysuria, flank pain and vaginal bleeding.  Musculoskeletal: Positive for back pain.  Neurological: Negative for dizziness, syncope, weakness and headaches.  All other systems reviewed and are negative.   Physical Exam   Patient Vitals for the past 24 hrs:  BP Temp Temp src Pulse Resp Height Weight  03/29/21 0342 (!) 103/55 -- -- 73 -- -- --  03/29/21 0031 100/65 98.1 F (36.7 C) Oral 82 20 5' 6"  (1.676 m) 151 lb 3.2 oz (68.6 kg)   Constitutional: Well-developed, well-nourished female in no acute distress.  Cardiovascular: normal rate & rhythm,  no murmur Respiratory: normal effort, lung sounds clear throughout GI: Abd soft, non-tender, gravid appropriate for gestational age. Pos BS x 4 MS: Extremities nontender, no edema, normal ROM Neurologic: Alert and oriented x 4.  GU: no CVA tenderness Pelvic: NEFG, physiologic discharge, no blood, blind swabs obtained  Fetal Tracing: reactive Baseline: 125 Variability: moderate Accelerations: 15x15 Decelerations: none Toco: relaxed   Labs: Results for orders placed or performed during the hospital encounter of 03/28/21 (from the past 24 hour(s))  GC/Chlamydia probe  amp ([place])not at [facility]     Status: None   Collection Time: 03/29/21  1:40 AM  Result Value Ref Range   Neisseria Gonorrhea Negative    Chlamydia Negative    Comment Normal Reference [place] Chlamydia - Negative    Comment      Normal Reference Range Neisseria Gonorrhea - Negative  Wet prep, genital     Status: Abnormal   Collection Time: 03/29/21  1:50 AM  Result Value Ref Range   Yeast Wet Prep HPF POC NONE SEEN NONE SEEN   Trich, Wet Prep NONE SEEN NONE SEEN   Clue Cells Wet Prep HPF POC NONE SEEN NONE SEEN   WBC, Wet Prep HPF POC MANY (A) NONE SEEN   Sperm NONE SEEN   Amnisure rupture of membrane (rom)not at [facility]     Status: None   Collection Time: 03/29/21  1:50 AM  Result Value Ref Range   Amnisure ROM NEGATIVE    Imaging:  No results found.  MAU Course: Orders Placed This Encounter  Procedures  . Wet prep, genital  . Amnisure rupture of membrane (rom)not at [facility]  . Discharge patient   Meds ordered this encounter  Medications  . acetaminophen (TYLENOL) tablet 1,000 mg  . cyclobenzaprine (FLEXERIL) tablet 5 mg  . cyclobenzaprine (FLEXERIL) 5 MG tablet    Sig: Take 1 tablet (5 mg total) by mouth at bedtime. As needed for back pain    Dispense:  20 tablet    Refill:  0    Order Specific Question:   Supervising Provider    Answer:   Merrily Pew   MDM: Pain clearly musculoskeletal in nature, pinpoint and easily identified over the rib insertional point in the upper back near the bra-line. No associating symptoms or decrease in range of motion.  Pt given 1017m Tylenol and 566mflexeril, pt verbalized complete relief of pain and readiness to go home.  Amnisure negative, reviewed normalcy of increased cervical mucus in the last few weeks of pregnancy.  Assessment: 1. Back pain affecting pregnancy in third trimester   2. NST (non-stress test) reactive   3. [redacted] weeks gestation of pregnancy   4. Intact amniotic membranes during pregnancy in third trimester     Plan: Discharge home in stable condition with preterm labor precautions.    Follow-up Information    [facility] Go to.   Specialty: Obstetrics and Gynecology Why: as scheduled for ongoing prenatal care Contact information: [phone]            Allergies as of 03/29/2021      Reactions   Shellfish Allergy Other (See Comments)   Tongue started burning and bumps developed on tongue.   Shrimp (diagnostic) Other (See Comments)   Tongue started burning and bumps developed on tongue.      Medication List    TAKE these medications   Accu-Chek Guide w/Device Kit  CitraNatal Assure 35-1 & 300 MG tablet One tablet and one capsule daily   cyclobenzaprine 5 MG tablet Commonly known as: FLEXERIL Take 1 tablet (5 mg total) by mouth at bedtime. As needed for back pain   IRON PO Take 1 tablet by mouth every other day.   Precision QID Test test strip Generic drug: glucose blood To monitor blood sugar during episodes of hypoglycemia, max 5 times per week.      Gaylan Gerold, CNM, MSN, [place] Certified Nurse Midwife, [facility]

## 2021-04-01 ENCOUNTER — Encounter: Payer: Self-pay | Admitting: Obstetrics & Gynecology

## 2021-04-01 ENCOUNTER — Other Ambulatory Visit: Payer: Self-pay

## 2021-04-01 ENCOUNTER — Ambulatory Visit (INDEPENDENT_AMBULATORY_CARE_PROVIDER_SITE_OTHER): Payer: Medicaid Other | Admitting: Obstetrics & Gynecology

## 2021-04-01 VITALS — BP 110/75 | HR 81 | Wt 152.5 lb

## 2021-04-01 DIAGNOSIS — Z3A36 36 weeks gestation of pregnancy: Secondary | ICD-10-CM

## 2021-04-01 DIAGNOSIS — Z3403 Encounter for supervision of normal first pregnancy, third trimester: Secondary | ICD-10-CM

## 2021-04-01 DIAGNOSIS — M545 Low back pain, unspecified: Secondary | ICD-10-CM

## 2021-04-01 DIAGNOSIS — E162 Hypoglycemia, unspecified: Secondary | ICD-10-CM | POA: Diagnosis not present

## 2021-04-01 LAB — POCT URINALYSIS DIPSTICK OB
Blood, UA: NEGATIVE
Glucose, UA: NEGATIVE
Ketones, UA: NEGATIVE
Nitrite, UA: NEGATIVE
POC,PROTEIN,UA: NEGATIVE

## 2021-04-01 NOTE — Progress Notes (Signed)
LOW-RISK PREGNANCY VISIT Patient name: Elizabeth Larsen MRN [id number]  Date of birth: Apr 12, 2001 Chief Complaint:   Routine Prenatal Visit (GBS today; low back pain)  History of Present Illness:   Elizabeth Larsen is a 20 y.o. G25P0000 female at [redacted]w[redacted]d with an Estimated Date of Delivery: 04/27/21 being seen today for ongoing management of a low-risk pregnancy.  Depression screen [facility] 2/9 01/27/2021 10/13/2020 09/07/2020 07/23/2020 07/06/2020  Decreased Interest 0 0 0 0 0  Down, Depressed, Hopeless 0 0 0 0 0  PHQ - 2 Score 0 0 0 0 0  Altered sleeping 2 0 1 0 2  Tired, decreased energy 1 0 0 0 0  Change in appetite 0 0 0 0 0  Feeling bad or failure about yourself  0 0 0 0 0  Trouble concentrating 0 0 0 0 0  Moving slowly or fidgety/restless 0 0 0 0 1  Suicidal thoughts 0 0 0 0 0  PHQ-9 Score 3 0 1 0 3  Difficult doing work/chores - - - - Not difficult at all    Today she reports no complaints. Contractions: Irregular. Vag. Bleeding: None.  Movement: Present. denies leaking of fluid. Review of Systems:   Pertinent items are noted in HPI Denies abnormal vaginal discharge w/ itching/odor/irritation, headaches, visual changes, shortness of breath, chest pain, abdominal pain, severe nausea/vomiting, or problems with urination or bowel movements unless otherwise stated above. Pertinent History Reviewed:  Reviewed past medical,surgical, social, obstetrical and family history.  Reviewed problem list, medications and allergies. Physical Assessment:   Vitals:   04/01/21 1259  BP: 110/75  Pulse: 81  Weight: 152 lb 8 oz (69.2 kg)  Body mass index is 24.61 kg/m.        Physical Examination:   General appearance: Well appearing, and in no distress  Mental status: Alert, oriented to person, place, and time  Skin: Warm & dry  Cardiovascular: Normal heart rate noted  Respiratory: Normal respiratory effort, no distress  Abdomen: Soft, gravid, nontender  Pelvic: Cervical exam performed   Dilation: 1.5 Effacement (%): 20 Station: -2  Extremities: Edema: Trace  Fetal Status: Fetal Heart Rate (bpm): 145 Fundal Height: 34 cm Movement: Present Presentation: Vertex  Chaperone: Malachy Mood    Results for orders placed or performed in visit on 04/01/21 (from the past 24 hour(s))  POC Urinalysis Dipstick OB   Collection Time: 04/01/21  1:05 PM  Result Value Ref Range   Color, UA     Clarity, UA     Glucose, UA Negative Negative   Bilirubin, UA     Ketones, UA neg    Spec Grav, UA     Blood, UA neg    pH, UA     POC,PROTEIN,UA Negative Negative, Trace, Small (1+), Moderate (2+), Large (3+), 4+   Urobilinogen, UA     Nitrite, UA neg    Leukocytes, UA Moderate (2+) (A) Negative   Appearance     Odor      Assessment & Plan:  1) Low-risk pregnancy G1P0000 at [redacted]w[redacted]d with an Estimated Date of Delivery: 04/27/21      Meds: No orders of the defined types were placed in this encounter.  Labs/procedures today:   Plan:  Continue routine obstetrical care  Next visit: prefers in person    Reviewed: Term labor symptoms and general obstetric precautions including but not limited to vaginal bleeding, contractions, leaking of fluid and fetal movement were reviewed in detail with the patient.  All  questions were answered. Has home bp cuff. Rx faxed to . Check bp weekly, let us know if >140/90.   Follow-up: Return in about 11 days (around 04/12/2021) for LROB.  Orders Placed This Encounter  Procedures  . Strep Gp B NAA  . POC Urinalysis Dipstick OB    Lazaro Arms, MD 04/01/2021 1:19 PM

## 2021-04-03 LAB — STREP GP B NAA: Strep Gp B NAA: NEGATIVE

## 2021-04-04 DIAGNOSIS — E162 Hypoglycemia, unspecified: Secondary | ICD-10-CM | POA: Diagnosis not present

## 2021-04-12 ENCOUNTER — Encounter: Payer: Self-pay | Admitting: Obstetrics & Gynecology

## 2021-04-12 ENCOUNTER — Ambulatory Visit (INDEPENDENT_AMBULATORY_CARE_PROVIDER_SITE_OTHER): Payer: Medicaid Other | Admitting: Obstetrics & Gynecology

## 2021-04-12 ENCOUNTER — Encounter (HOSPITAL_COMMUNITY): Payer: Self-pay | Admitting: Obstetrics and Gynecology

## 2021-04-12 ENCOUNTER — Inpatient Hospital Stay (HOSPITAL_COMMUNITY)
Admission: AD | Admit: 2021-04-12 | Discharge: 2021-04-13 | Disposition: A | Discharge status: Home / Self Care | Payer: Medicaid Other | Attending: Obstetrics and Gynecology | Admitting: Obstetrics and Gynecology

## 2021-04-12 ENCOUNTER — Other Ambulatory Visit: Payer: Self-pay

## 2021-04-12 VITALS — BP 109/71 | HR 80 | Wt 153.0 lb

## 2021-04-12 DIAGNOSIS — O479 False labor, unspecified: Secondary | ICD-10-CM

## 2021-04-12 DIAGNOSIS — Z3403 Encounter for supervision of normal first pregnancy, third trimester: Secondary | ICD-10-CM

## 2021-04-12 DIAGNOSIS — Z3A38 38 weeks gestation of pregnancy: Secondary | ICD-10-CM | POA: Insufficient documentation

## 2021-04-12 DIAGNOSIS — Z3009 Encounter for other general counseling and advice on contraception: Secondary | ICD-10-CM

## 2021-04-12 DIAGNOSIS — O471 False labor at or after 37 completed weeks of gestation: Secondary | ICD-10-CM | POA: Insufficient documentation

## 2021-04-12 NOTE — MAU Note (Signed)
Saw doctor this afternoon and had cervical ck and membrane sweep. Tonight at 2100 I went to [facility] and saw a lot of thick blood on tissue and in toilet. Knew I would bleed some but think it is too much. Unsure if contracting or not.

## 2021-04-12 NOTE — Progress Notes (Signed)
   LOW-RISK PREGNANCY VISIT Patient name: Elizabeth Larsen MRN [id number]  Date of birth: 29-Mar-2001 Chief Complaint:   Routine Prenatal Visit  History of Present Illness:   Elizabeth Larsen is a 20 y.o. G54P0000 female at [redacted]w[redacted]d with an Estimated Date of Delivery: 04/27/21 being seen today for ongoing management of a low-risk pregnancy.  Depression screen [facility] 2/9 01/27/2021 10/13/2020 09/07/2020 07/23/2020 07/06/2020  Decreased Interest 0 0 0 0 0  Down, Depressed, Hopeless 0 0 0 0 0  PHQ - 2 Score 0 0 0 0 0  Altered sleeping 2 0 1 0 2  Tired, decreased energy 1 0 0 0 0  Change in appetite 0 0 0 0 0  Feeling bad or failure about yourself  0 0 0 0 0  Trouble concentrating 0 0 0 0 0  Moving slowly or fidgety/restless 0 0 0 0 1  Suicidal thoughts 0 0 0 0 0  PHQ-9 Score 3 0 1 0 3  Difficult doing work/chores - - - - Not difficult at all    Today she reports occasional contractions. Contractions: Irritability. Vag. Bleeding: None.  Movement: Present. denies leaking of fluid. Review of Systems:   Pertinent items are noted in HPI Denies abnormal vaginal discharge w/ itching/odor/irritation, headaches, visual changes, shortness of breath, chest pain, abdominal pain, severe nausea/vomiting, or problems with urination or bowel movements unless otherwise stated above. Pertinent History Reviewed:  Reviewed past medical,surgical, social, obstetrical and family history.  Reviewed problem list, medications and allergies.  Physical Assessment:   Vitals:   04/12/21 1632  BP: 109/71  Pulse: 80  Weight: 153 lb (69.4 kg)  Body mass index is 24.69 kg/m.        Physical Examination:   General appearance: Well appearing, and in no distress  Mental status: Alert, oriented to person, place, and time  Skin: Warm & dry  Respiratory: Normal respiratory effort, no distress  Abdomen: Soft, gravid, nontender  Pelvic: Cervical exam performed  Dilation: 2.5 Effacement (%): 60 Station: -2  Extremities:  Edema: Trace  Psych:  mood and affect appropriate  Fetal Status: Fetal Heart Rate (bpm): 140 Fundal Height: 36 cm Movement: Present Presentation: Vertex  Chaperone: declined    Results for orders placed or performed in visit on 04/12/21 (from the past 24 hour(s))  POCT urine pregnancy   Collection Time: 04/12/21  4:51 PM  Result Value Ref Range   Preg Test, Ur Negative Negative     Assessment & Plan:  1) Low-risk pregnancy G1P0000 at [redacted]w[redacted]d with an Estimated Date of Delivery: 04/27/21   2) continue routine OB care   Meds: No orders of the defined types were placed in this encounter.  Labs/procedures today:  Plan:  Continue routine obstetrical care  Next visit: prefers in person    Reviewed: Term labor symptoms and general obstetric precautions including but not limited to vaginal bleeding, contractions, leaking of fluid and fetal movement were reviewed in detail with the patient.  All questions were answered.   Follow-up: Return in about 1 week (around 04/19/2021) for LROB visit.  Orders Placed This Encounter  Procedures  . POCT urine pregnancy    Myna Hidalgo, DO Attending Obstetrician & Gynecologist, [facility] for [facility], [facility]

## 2021-04-13 ENCOUNTER — Encounter (HOSPITAL_COMMUNITY): Payer: Self-pay | Admitting: Obstetrics and Gynecology

## 2021-04-13 DIAGNOSIS — Z3A38 38 weeks gestation of pregnancy: Secondary | ICD-10-CM

## 2021-04-13 DIAGNOSIS — O471 False labor at or after 37 completed weeks of gestation: Secondary | ICD-10-CM

## 2021-04-13 NOTE — MAU Provider Note (Signed)
S: Ms. Elizabeth Larsen is a 20 y.o. G1P0000 at [redacted]w[redacted]d  who presents to MAU today for labor evaluation.     Cervical exam by RN:  Dilation: 4 Effacement (%): 70 Station: -2 Presentation: Vertex Exam by:: Lestine Box, RN  Fetal Monitoring: Baseline: 135 Variability: average Accelerations: present Decelerations: absent Contractions: irregular  MDM Discussed patient with RN. NST reviewed.   A: SIUP at [redacted]w[redacted]d  False labor  P: Discharge home Labor precautions and kick counts included in AVS Patient to follow-up with office as scheduled  Patient may return to MAU as needed or when in labor   Aviva Signs, [place] 04/13/2021 2:41 AM

## 2021-04-14 ENCOUNTER — Encounter: Payer: Self-pay | Admitting: *Deleted

## 2021-04-14 NOTE — Progress Notes (Signed)
Order(s) created erroneously. Erroneous order ID: [id number]  Order moved by: SHEALY, DEBRA D  Order move date/time: 04/14/2021 3:49 PM  Source Patient: [id number]  Source Contact: 04/12/2021  Destination Patient: [id number]  Destination Contact: 07/08/2020 

## 2021-04-17 ENCOUNTER — Encounter (HOSPITAL_COMMUNITY): Payer: Self-pay | Admitting: Family Medicine

## 2021-04-17 ENCOUNTER — Other Ambulatory Visit: Payer: Self-pay

## 2021-04-17 ENCOUNTER — Inpatient Hospital Stay (HOSPITAL_COMMUNITY): Payer: Medicaid Other | Admitting: Anesthesiology

## 2021-04-17 ENCOUNTER — Inpatient Hospital Stay (HOSPITAL_COMMUNITY)
Admission: AD | Admit: 2021-04-17 | Discharge: 2021-04-19 | DRG: 807 | Disposition: A | Discharge status: Home / Self Care | Payer: Medicaid Other | Attending: Family Medicine | Admitting: Family Medicine

## 2021-04-17 DIAGNOSIS — Z3A38 38 weeks gestation of pregnancy: Secondary | ICD-10-CM

## 2021-04-17 DIAGNOSIS — F17290 Nicotine dependence, other tobacco product, uncomplicated: Secondary | ICD-10-CM | POA: Diagnosis present

## 2021-04-17 DIAGNOSIS — O329XX0 Maternal care for malpresentation of fetus, unspecified, not applicable or unspecified: Secondary | ICD-10-CM

## 2021-04-17 DIAGNOSIS — Z3A00 Weeks of gestation of pregnancy not specified: Secondary | ICD-10-CM

## 2021-04-17 DIAGNOSIS — O99334 Smoking (tobacco) complicating childbirth: Secondary | ICD-10-CM | POA: Diagnosis not present

## 2021-04-17 DIAGNOSIS — O26893 Other specified pregnancy related conditions, third trimester: Secondary | ICD-10-CM | POA: Diagnosis not present

## 2021-04-17 DIAGNOSIS — O4292 Full-term premature rupture of membranes, unspecified as to length of time between rupture and onset of labor: Principal | ICD-10-CM | POA: Diagnosis present

## 2021-04-17 DIAGNOSIS — Z20822 Contact with and (suspected) exposure to covid-19: Secondary | ICD-10-CM | POA: Diagnosis not present

## 2021-04-17 DIAGNOSIS — O4290 Premature rupture of membranes, unspecified as to length of time between rupture and onset of labor, unspecified weeks of gestation: Secondary | ICD-10-CM

## 2021-04-17 DIAGNOSIS — O4202 Full-term premature rupture of membranes, onset of labor within 24 hours of rupture: Secondary | ICD-10-CM | POA: Diagnosis not present

## 2021-04-17 LAB — CBC
HCT: 30.5 % — ABNORMAL LOW (ref 36.0–46.0)
Hemoglobin: 9.3 g/dL — ABNORMAL LOW (ref 12.0–15.0)
MCH: 24 pg — ABNORMAL LOW (ref 26.0–34.0)
MCHC: 30.5 g/dL (ref 30.0–36.0)
MCV: 78.6 fL — ABNORMAL LOW (ref 80.0–100.0)
Platelets: 328 10*3/uL (ref 150–400)
RBC: 3.88 MIL/uL (ref 3.87–5.11)
RDW: 16.5 % — ABNORMAL HIGH (ref 11.5–15.5)
WBC: 12.7 10*3/uL — ABNORMAL HIGH (ref 4.0–10.5)
nRBC: 0 % (ref 0.0–0.2)

## 2021-04-17 LAB — POCT FERN TEST: POCT Fern Test: POSITIVE

## 2021-04-17 LAB — RESP PANEL BY RT-PCR (FLU A&B, COVID) ARPGX2
Influenza A by PCR: NEGATIVE
Influenza B by PCR: NEGATIVE
SARS Coronavirus 2 by RT PCR: NEGATIVE

## 2021-04-17 LAB — TYPE AND SCREEN
ABO/RH(D): A POS
Antibody Screen: NEGATIVE

## 2021-04-17 MED ORDER — LIDOCAINE HCL (PF) 1 % IJ SOLN
INTRAMUSCULAR | Status: DC | PRN
  Administered 2021-04-17: 8 mL via EPIDURAL

## 2021-04-17 MED ORDER — OXYCODONE-ACETAMINOPHEN 5-325 MG PO TABS: 2.0000 | ORAL_TABLET | ORAL | Status: DC | PRN

## 2021-04-17 MED ORDER — LACTATED RINGERS IV SOLN
500.0000 mL | INTRAVENOUS | Status: DC | PRN
Start: 2021-04-17 — End: 2021-04-18

## 2021-04-17 MED ORDER — PHENYLEPHRINE 40 MCG/ML (10ML) SYRINGE FOR IV PUSH (FOR BLOOD PRESSURE SUPPORT): 80.0000 ug | PREFILLED_SYRINGE | INTRAVENOUS | Status: DC | PRN

## 2021-04-17 MED ORDER — EPHEDRINE 5 MG/ML INJ: 10.0000 mg | INTRAVENOUS | Status: DC | PRN

## 2021-04-17 MED ORDER — FENTANYL CITRATE (PF) 100 MCG/2ML IJ SOLN
50.0000 ug | INTRAMUSCULAR | Status: DC | PRN
  Administered 2021-04-17: 50 ug via INTRAVENOUS
  Filled 2021-04-17: qty 2

## 2021-04-17 MED ORDER — LACTATED RINGERS IV SOLN: INTRAVENOUS | Status: DC

## 2021-04-17 MED ORDER — OXYTOCIN-SODIUM CHLORIDE 30-0.9 UT/500ML-% IV SOLN: 2.5000 [IU]/h | INTRAVENOUS | Status: DC

## 2021-04-17 MED ORDER — HYDROXYZINE HCL 50 MG PO TABS: 50.0000 mg | ORAL_TABLET | Freq: Four times a day (QID) | ORAL | Status: DC | PRN

## 2021-04-17 MED ORDER — FENTANYL-BUPIVACAINE-NACL 0.5-0.125-0.9 MG/250ML-% EP SOLN
12.0000 mL/h | EPIDURAL | Status: DC | PRN
  Administered 2021-04-17: 12 mL/h via EPIDURAL
  Filled 2021-04-17: qty 250

## 2021-04-17 MED ORDER — DIPHENHYDRAMINE HCL 50 MG/ML IJ SOLN: 12.5000 mg | INTRAMUSCULAR | Status: DC | PRN

## 2021-04-17 MED ORDER — ONDANSETRON HCL 4 MG/2ML IJ SOLN: 4.0000 mg | Freq: Four times a day (QID) | INTRAMUSCULAR | Status: DC | PRN

## 2021-04-17 MED ORDER — FENTANYL-BUPIVACAINE-NACL 0.5-0.125-0.9 MG/250ML-% EP SOLN: 12.0000 mL/h | EPIDURAL | Status: DC | PRN

## 2021-04-17 MED ORDER — FENTANYL CITRATE (PF) 100 MCG/2ML IJ SOLN: 100.0000 ug | INTRAMUSCULAR | Status: DC | PRN

## 2021-04-17 MED ORDER — FLEET ENEMA 7-19 GM/118ML RE ENEM: 1.0000 | ENEMA | RECTAL | Status: DC | PRN

## 2021-04-17 MED ORDER — LACTATED RINGERS IV SOLN
500.0000 mL | Freq: Once | INTRAVENOUS | Status: AC
  Administered 2021-04-17: 500 mL via INTRAVENOUS

## 2021-04-17 MED ORDER — LIDOCAINE HCL (PF) 1 % IJ SOLN: 30.0000 mL | INTRAMUSCULAR | Status: DC | PRN

## 2021-04-17 MED ORDER — ACETAMINOPHEN 325 MG PO TABS: 650.0000 mg | ORAL_TABLET | ORAL | Status: DC | PRN

## 2021-04-17 MED ORDER — SOD CITRATE-CITRIC ACID 500-334 MG/5ML PO SOLN: 30.0000 mL | ORAL | Status: DC | PRN

## 2021-04-17 MED ORDER — OXYTOCIN BOLUS FROM INFUSION: 333.0000 mL | Freq: Once | INTRAVENOUS | Status: DC

## 2021-04-17 MED ORDER — OXYTOCIN-SODIUM CHLORIDE 30-0.9 UT/500ML-% IV SOLN
1.0000 m[IU]/min | INTRAVENOUS | Status: DC
  Administered 2021-04-17: 2 m[IU]/min via INTRAVENOUS
  Filled 2021-04-17: qty 500

## 2021-04-17 MED ORDER — OXYCODONE-ACETAMINOPHEN 5-325 MG PO TABS: 1.0000 | ORAL_TABLET | ORAL | Status: DC | PRN

## 2021-04-17 MED ORDER — TERBUTALINE SULFATE 1 MG/ML IJ SOLN: 0.2500 mg | Freq: Once | INTRAMUSCULAR | Status: DC | PRN

## 2021-04-17 NOTE — Progress Notes (Signed)
Patient ID: Elizabeth Larsen, female   DOB: June 07, 2001, 20 y.o.   MRN: [id number] Elizabeth Larsen is a 20 y.o. G1P0000 at [redacted]w[redacted]d admitted for PROM  Subjective: feeling contractions some, but not uncomfortable and no complaints, eating supper  Objective: BP 107/72   Pulse 86   Temp 97.7 F (36.5 C) (Oral)   Resp 16   Ht 5\' 6"  (1.676 m)   Wt 68 kg   LMP 03/02/2020 (Approximate) Comment: stopped birth control last Wednesday  SpO2 99% Comment: room air  BMI 24.21 kg/m  No intake/output data recorded.  FHR baseline 125 bpm, Variability: moderate, Accelerations:present, Decelerations:  Absent Toco: regular, every 2-4 minutes   SVE:   Exam by:: [id number], NP  Last exam 4/70/-2 on 5/25 Vtx by u/s on admisison  Labs: Lab Results  Component Value Date   WBC 12.7 (H) 04/17/2021   HGB 9.3 (L) 04/17/2021   HCT 30.5 (L) 04/17/2021   MCV 78.6 (L) 04/17/2021   PLT 328 04/17/2021    Assessment / Plan: PROM @ 1500, regular mild contractions- pt feels but not at all uncomfortable. Wants to finish eating supper, will let 04/19/2021 know when ready for SVE  Labor: early Fetal Wellbeing:  Category I Pain Control:  n/a Pre-eclampsia: N/A I/D:  GBS neg Anticipated MOD: NSVB  [place] CNM, WHNP-BC 04/17/2021, 7:42 PM

## 2021-04-17 NOTE — Anesthesia Procedure Notes (Signed)
Epidural Patient location during procedure: OB Start time: 04/17/2021 11:16 PM End time: 04/17/2021 11:24 PM  Staffing Anesthesiologist: Bethena Midget, MD  Preanesthetic Checklist Completed: patient identified, IV checked, site marked, risks and benefits discussed, surgical consent, monitors and equipment checked, pre-op evaluation and timeout performed  Epidural Patient position: sitting Prep: DuraPrep and site prepped and draped Patient monitoring: continuous pulse ox and blood pressure Approach: midline Location: L4-L5 Injection technique: LOR air  Needle:  Needle type: Tuohy  Needle gauge: 17 G Needle length: 9 cm and 9 Needle insertion depth: 5 cm cm Catheter type: closed end flexible Catheter size: 19 Gauge Catheter at skin depth: 10 cm Test dose: negative  Assessment Events: blood not aspirated, injection not painful, no injection resistance, no paresthesia and negative IV test

## 2021-04-17 NOTE — MAU Note (Signed)
Elizabeth Larsen is a 20 y.o. at [redacted]w[redacted]d here in MAU reporting: states she was at the pool today and when she was sitting on the edge of the pool she noticed some fluid. States the leaking comes and goes and it did run down her leg. States she is wearing a pad and that feels wet. No bleeding. Having mild contractions, they are about every 10 minutes. +FM  Onset of complaint: today  Pain score: 5/10  Vitals:   04/17/21 1634  BP: 104/65  Pulse: 93  Resp: 16  Temp: 97.9 F (36.6 C)  SpO2: 99%     FHT:130  Lab orders placed from triage: none

## 2021-04-17 NOTE — Progress Notes (Signed)
Pt eating

## 2021-04-17 NOTE — Anesthesia Preprocedure Evaluation (Signed)
Anesthesia Evaluation  Patient identified by MRN, date of birth, ID band Patient awake    Reviewed: Allergy & Precautions, H&P , NPO status , Patient's Chart, lab work & pertinent test results, reviewed documented beta blocker date and time   Airway Mallampati: I  TM Distance: >3 FB Neck ROM: full    Dental no notable dental hx. (+) Teeth Intact, Dental Advisory Given   Pulmonary neg pulmonary ROS, Current Smoker and Patient abstained from smoking.,    Pulmonary exam normal breath sounds clear to auscultation       Cardiovascular + angina negative cardio ROS Normal cardiovascular exam Rhythm:regular Rate:Normal     Neuro/Psych negative neurological ROS  negative psych ROS   GI/Hepatic negative GI ROS, Neg liver ROS,   Endo/Other  negative endocrine ROS  Renal/GU negative Renal ROS  negative genitourinary   Musculoskeletal   Abdominal   Peds  Hematology negative hematology ROS (+)   Anesthesia Other Findings   Reproductive/Obstetrics (+) Pregnancy                             Anesthesia Physical Anesthesia Plan  ASA: II  Anesthesia Plan: Epidural   Post-op Pain Management:    Induction:   PONV Risk Score and Plan:   Airway Management Planned: Natural Airway  Additional Equipment: None  Intra-op Plan:   Post-operative Plan:   Informed Consent: I have reviewed the patients History and Physical, chart, labs and discussed the procedure including the risks, benefits and alternatives for the proposed anesthesia with the patient or authorized representative who has indicated his/her understanding and acceptance.       Plan Discussed with: CRNA and Anesthesiologist  Anesthesia Plan Comments:         Anesthesia Quick Evaluation

## 2021-04-17 NOTE — MAU Provider Note (Signed)
Pt informed that the ultrasound is considered a limited OB ultrasound and is not intended to be a complete ultrasound exam.  Patient also informed that the ultrasound is not being completed with the intent of assessing for fetal or placental anomalies or any pelvic abnormalities.  Explained that the purpose of today's ultrasound is to assess for  presentation (VTX).  Patient acknowledges the purpose of the exam and the limitations of the study.    Marylen Ponto, NP  4:53 PM 04/17/2021

## 2021-04-17 NOTE — Progress Notes (Signed)
S: Doing well. Eager to get the labor process going. She is still feeling mild contractions, but they are manageable.    O: Vitals:   04/17/21 1725 04/17/21 1755 04/17/21 1856 04/17/21 1923  BP: 109/69 115/75 107/72 99/63  Pulse: 82 77 86 88  Resp: 20 18 16 16   Temp: 97.7 F (36.5 C)   98 F (36.7 C)  TempSrc: Oral   Oral  SpO2:      Weight: 68 kg   68 kg  Height: 5\' 6"  (1.676 m)   5\' 6"  (1.676 m)     FHT:  FHR: 135 bpm, variability: moderate,  accelerations:  Present,  decelerations:  Absent UC:   Irregular contractions, every 2-6 minutes  SVE:   Dilation: 5.5 Effacement (%): 80 Station: -1 Exam by:: SNM   A / P: Spontaneous labor, with slow progression.   -Plan for pitocin 2x2 PRN. R/B discussed with Emilija. Patient agreeable to plan.  Fetal Wellbeing:  Category I   -Continue to monitor for signs of distress.  Pain Control: Currently Labor support without medications, planning IV pain meds and possibly an epidural. Pt may have epidural upon request.   Anticipated MOD:  NSVD  [place] M[place] ) [email or web address], BSN, RNC-OB  Student Nurse-Midwife   04/17/2021  9:08 PM

## 2021-04-17 NOTE — H&P (Signed)
Elizabeth Larsen is a 20 y.o. G1P0000 female at 75w4dby 5wk u/s presenting w/ SROM clear fluid at 1500 while sitting by the pool.   Reports active fetal movement, contractions: approximately q 123ms-not strong, vaginal bleeding: none, membranes: ruptured, clear fluid. Was 4/70/-2 on 5/25 MAU visit. Initiated prenatal care at [facility] at 12 wks.    This pregnancy complicated by: Preterm uterine contractions- rx'd procardia Bleeding/friable cx w/ Monsels application @ 3278EUMPrenatal History/Complications:  G1  Past Medical History: Past Medical History:  Diagnosis Date  . Chlamydia 08/03/2020   Treated 08/03/20 PCO________  . History of enlarged tonsils   . Hypoglycemia     Past Surgical History: Past Surgical History:  Procedure Laterality Date  . TONSILLECTOMY AND ADENOIDECTOMY N/A 02/02/2020   Procedure: TONSILLECTOMY AND ADENOIDECTOMY;  Surgeon: TeLeta BaptistMD;  [facility]: [facility];  Laterality: N/A;    Obstetrical History: OB History    Gravida  1   Para  0   Term  0   Preterm  0   AB  0   Living  0     SAB  0   IAB  0   Ectopic  0   Multiple  0   Live Births  0           Social History: Social History   Socioeconomic History  . Marital status: Single    Spouse name: Not on file  . Number of children: Not on file  . Years of education: Not on file  . Highest education level: Not on file  Occupational History  . Not on file  Tobacco Use  . Smoking status: Current Every Day Smoker    Types: E-cigarettes  . Smokeless tobacco: Never Used  . Tobacco comment: Vapes   Vaping Use  . Vaping Use: Every day  . Substances: Nicotine, Flavoring  Substance and Sexual Activity  . Alcohol use: Not Currently    Comment: Occasional  . Drug use: Never  . Sexual activity: Yes    Birth control/protection: None  Other Topics Concern  . Not on file  Social History Narrative  . Not on file   Social Determinants of Health    Financial Resource Strain: Low Risk   . Difficulty of Paying Living Expenses: Not very hard  Food Insecurity: No Food Insecurity  . Worried About RuCharity fundraisern the Last Year: Never true  . Ran Out of Food in the Last Year: Never true  Transportation Needs: No Transportation Needs  . Lack of Transportation (Medical): No  . Lack of Transportation (Non-Medical): No  Physical Activity: Insufficiently Active  . Days of Exercise per Week: 3 days  . Minutes of Exercise per Session: 10 min  Stress: No Stress Concern Present  . Feeling of Stress : Not at all  Social Connections: Socially Isolated  . Frequency of Communication with Friends and Family: More than three times a week  . Frequency of Social Gatherings with Friends and Family: Twice a week  . Attends Religious Services: Never  . Active Member of Clubs or Organizations: No  . Attends ClArchivisteetings: Never  . Marital Status: Never married    Family History: Family History  Problem Relation Age of Onset  . Heart attack Father   . Polycystic kidney disease Sister     Allergies: Allergies  Allergen Reactions  . Shellfish Allergy Other (See Comments)    Tongue started burning  and bumps developed on tongue.  . Shrimp (Diagnostic) Other (See Comments)    Tongue started burning and bumps developed on tongue.    Medications Prior to Admission  Medication Sig Dispense Refill Last Dose  . Prenat w/o A-FeCbGl-DSS-FA-DHA (CITRANATAL ASSURE) 35-1 & 300 MG tablet One tablet and one capsule daily 30 each 11 04/17/2021 at Unknown time  . Blood Glucose Monitoring Suppl (ACCU-CHEK GUIDE) w/Device KIT      . cyclobenzaprine (FLEXERIL) 5 MG tablet Take 1 tablet (5 mg total) by mouth at bedtime. As needed for back pain (Patient not taking: No sig reported) 20 tablet 0   . Ferrous Sulfate (IRON PO) Take 1 tablet by mouth every other day. (Patient not taking: No sig reported)     . glucose blood (PRECISION QID TEST)  test strip To monitor blood sugar during episodes of hypoglycemia, max 5 times per week.       Review of Systems  Pertinent pos/neg as indicated in HPI  Blood pressure 104/65, pulse 93, temperature 97.9 F (36.6 C), temperature source Oral, resp. rate 16, height _0  (1.676 m), weight 68.4 kg, last menstrual period 03/02/2020, SpO2 99 %. General appearance: alert, cooperative and no distress Lungs: clear to auscultation bilaterally Heart: regular rate and rhythm Abdomen: gravid, soft, non-tender Extremities: no edema  Fetal monitoring: FHR: 135 bpm, variability: moderate,  Accelerations: Present,  decelerations:  Absent Uterine activity: irregular Exam by:: Vernice Jefferson, NP Presentation: cephalic by bedside u/s in MAU   Prenatal labs: ABO, Rh: A/Positive/-- (11/24 1152) Antibody: Negative (03/10 0840) Rubella: 16.30 (11/24 1152) RPR: Non Reactive (03/10 0840)  HBsAg: Negative (11/24 1152)  HIV: Non Reactive (03/10 0840)  GBS: Negative/-- (05/13 1424)  2hr GTT: 78/97/96  Prenatal Transfer Tool  Maternal Diabetes: No Genetic Screening: Normal Maternal Ultrasounds/Referrals: Normal Fetal Ultrasounds or other Referrals:  None Maternal Substance Abuse:  No Significant Maternal Medications:  Meds include: Other: flexeril, po Fe Significant Maternal Lab Results: Group B Strep negative  Results for orders placed or performed during the hospital encounter of 04/17/21 (from the past 24 hour(s))  Fern Test   Collection Time: 04/17/21  4:49 PM  Result Value Ref Range   POCT Fern Test Positive = ruptured amniotic membanes      Assessment:  29w4dSIUP  G1P0000  SROM/early labor  Cat 1 FHR  GBS Negative/-- (05/13 1424)  Plan:  Admit to L&D  IV pain meds/epidural prn active labor  Expectant management  Anticipate NSVB   Plans to breastfeed  Contraception: depo in hospital  [place] WHNP-BC 04/17/2021, 4:58 PM

## 2021-04-18 ENCOUNTER — Encounter (HOSPITAL_COMMUNITY): Payer: Self-pay | Admitting: Family Medicine

## 2021-04-18 DIAGNOSIS — Z3A38 38 weeks gestation of pregnancy: Secondary | ICD-10-CM | POA: Diagnosis not present

## 2021-04-18 DIAGNOSIS — O4202 Full-term premature rupture of membranes, onset of labor within 24 hours of rupture: Secondary | ICD-10-CM | POA: Diagnosis not present

## 2021-04-18 LAB — RPR: RPR Ser Ql: NONREACTIVE

## 2021-04-18 MED ORDER — FERROUS SULFATE 300 (60 FE) MG/5ML PO SYRP: 300.0000 mg | ORAL_SOLUTION | ORAL | Status: DC

## 2021-04-18 MED ORDER — MEASLES, MUMPS & RUBELLA VAC IJ SOLR: 0.5000 mL | Freq: Once | INTRAMUSCULAR | Status: DC

## 2021-04-18 MED ORDER — DIPHENHYDRAMINE HCL 25 MG PO CAPS: 25.0000 mg | ORAL_CAPSULE | Freq: Four times a day (QID) | ORAL | Status: DC | PRN

## 2021-04-18 MED ORDER — ACETAMINOPHEN 325 MG PO TABS: 650.0000 mg | ORAL_TABLET | ORAL | Status: DC | PRN

## 2021-04-18 MED ORDER — ONDANSETRON HCL 4 MG PO TABS
4.0000 mg | ORAL_TABLET | ORAL | Status: DC | PRN
Start: 2021-04-18 — End: 2021-04-19

## 2021-04-18 MED ORDER — SENNOSIDES-DOCUSATE SODIUM 8.6-50 MG PO TABS
2.0000 | ORAL_TABLET | Freq: Every day | ORAL | Status: DC
  Filled 2021-04-18: qty 2

## 2021-04-18 MED ORDER — COMPLETENATE 29-1 MG PO CHEW
1.0000 | CHEWABLE_TABLET | Freq: Every day | ORAL | Status: DC
  Administered 2021-04-19: 1 via ORAL
  Filled 2021-04-18: qty 1

## 2021-04-18 MED ORDER — BENZOCAINE-MENTHOL 20-0.5 % EX AERO
1.0000 | INHALATION_SPRAY | CUTANEOUS | Status: DC | PRN
Start: 2021-04-18 — End: 2021-04-19

## 2021-04-18 MED ORDER — PRENATAL MULTIVITAMIN CH
1.0000 | ORAL_TABLET | Freq: Every day | ORAL | Status: DC
  Administered 2021-04-18: 0.5 via ORAL
  Filled 2021-04-18: qty 1

## 2021-04-18 MED ORDER — FERROUS SULFATE 325 (65 FE) MG PO TABS
325.0000 mg | ORAL_TABLET | ORAL | Status: DC
  Administered 2021-04-18: 325 mg via ORAL
  Filled 2021-04-18: qty 1

## 2021-04-18 MED ORDER — SIMETHICONE 80 MG PO CHEW: 80.0000 mg | CHEWABLE_TABLET | ORAL | Status: DC | PRN

## 2021-04-18 MED ORDER — TETANUS-DIPHTH-ACELL PERTUSSIS 5-2.5-18.5 LF-MCG/0.5 IM SUSY: 0.5000 mL | PREFILLED_SYRINGE | Freq: Once | INTRAMUSCULAR | Status: DC

## 2021-04-18 MED ORDER — DIBUCAINE (PERIANAL) 1 % EX OINT: 1.0000 "application " | TOPICAL_OINTMENT | CUTANEOUS | Status: DC | PRN

## 2021-04-18 MED ORDER — IBUPROFEN 100 MG/5ML PO SUSP
600.0000 mg | Freq: Four times a day (QID) | ORAL | Status: DC
  Administered 2021-04-18 – 2021-04-19 (×4): 600 mg via ORAL
  Filled 2021-04-18 (×5): qty 30

## 2021-04-18 MED ORDER — WITCH HAZEL-GLYCERIN EX PADS: 1.0000 "application " | MEDICATED_PAD | CUTANEOUS | Status: DC | PRN

## 2021-04-18 MED ORDER — IBUPROFEN 600 MG PO TABS
600.0000 mg | ORAL_TABLET | Freq: Four times a day (QID) | ORAL | Status: DC
  Filled 2021-04-18: qty 1

## 2021-04-18 MED ORDER — ACETAMINOPHEN 160 MG/5ML PO SOLN: 650.0000 mg | ORAL | Status: DC | PRN

## 2021-04-18 MED ORDER — ONDANSETRON HCL 4 MG/2ML IJ SOLN: 4.0000 mg | INTRAMUSCULAR | Status: DC | PRN

## 2021-04-18 MED ORDER — MEDROXYPROGESTERONE ACETATE 150 MG/ML IM SUSP
150.0000 mg | INTRAMUSCULAR | Status: AC | PRN
  Administered 2021-04-19: 150 mg via INTRAMUSCULAR
  Filled 2021-04-18: qty 1

## 2021-04-18 MED ORDER — ZOLPIDEM TARTRATE 5 MG PO TABS: 5.0000 mg | ORAL_TABLET | Freq: Every evening | ORAL | Status: DC | PRN

## 2021-04-18 MED ORDER — COCONUT OIL OIL: 1.0000 "application " | TOPICAL_OIL | Status: DC | PRN

## 2021-04-18 NOTE — Progress Notes (Signed)
S: Doing well, pt comfortable with Epidural. Denies pelvic pressure or pain . Endorses bouts of anxiety since epidural. Expresses that she is just nervous, and is using her music to comfort her.    O: Vitals:   04/17/21 2350 04/18/21 0030 04/18/21 0031 04/18/21 0035  BP:   112/61   Pulse:   (!) 104   Resp:      Temp:      TempSrc:      SpO2: 100% 100%  100%  Weight:      Height:         FHT:  FHR: 125 bpm, variability: moderate,  accelerations:  Present,  decelerations:  Absent UC:   regular, every 1-3  Minutes; moderate to palpation. SVE:   Dilation: 6 Effacement (%): 80 Station: -1 Exam by:: Sonia R, RN   A / P: SOL via SROM at 1500, progressing well on pitocin. S/p epidural. Pt anxious about delivery with maternal HR up to 140's.    -Pt has a fore-bag present on exam. Offered to AROM and place FSE for closer fetal monitoring. Pt declined. Pt encouraged to rest. Will continue to monitor maternal HR closely.  Fetal Wellbeing:  Category I Pain Control:  Epidural  Anticipated MOD:  NSVD  Keirra Zeimet Danella Deis) Suzie Portela, BSN, RNC-OB  Student Nurse-Midwife   04/18/2021  00:30 AM

## 2021-04-18 NOTE — Anesthesia Postprocedure Evaluation (Signed)
Anesthesia Post Note  Patient: Elizabeth Larsen  Procedure(s) Performed: AN AD HOC LABOR EPIDURAL     Patient location during evaluation: Mother Baby Anesthesia Type: Epidural Level of consciousness: awake Pain management: satisfactory to patient Vital Signs Assessment: post-procedure vital signs reviewed and stable Respiratory status: spontaneous breathing Cardiovascular status: stable Anesthetic complications: no   No complications documented.  Last Vitals:  Vitals:   04/18/21 0839 04/18/21 0941  BP: 109/72 109/79  Pulse: 86 77  Resp: 18 18  Temp: 36.6 C 36.9 C  SpO2: 100%     Last Pain:  Vitals:   04/18/21 0941  TempSrc: Oral  PainSc: 0-No pain   Pain Goal:                   [employer]

## 2021-04-18 NOTE — Lactation Note (Signed)
This note was copied from a baby's chart. Lactation Consultation Note  Patient Name: Elizabeth Larsen OVANV'B Date: 04/18/2021 Reason for consult: L&D Initial assessment;Primapara;1st time breastfeeding;Early term 37-38.6wks;Other (Comment) (LC visited mom on L/D less than 60 mins / baby STS /mom, awake,rooting. LC mentioned to mom- soon after birth the benefits of latching. Per mom I plan to pump and bottle feed only. LC reassured mom  when she is transferred to [facility] - a DEBP will be set up.) Age:22 hours  Maternal Data    Feeding Mother's Current Feeding Choice: Breast Milk and Formula  LATCH Score                    Lactation Tools Discussed/Used    Interventions    Discharge    Consult Status Consult Status: Follow-up Date: 04/18/21 Follow-up type: In-patient    Elizabeth Larsen 04/18/2021, 7:46 AM

## 2021-04-19 MED ORDER — FERROUS SULFATE 300 (60 FE) MG/5ML PO SYRP: 300.0000 mg | ORAL_SOLUTION | ORAL | 1 refills | Status: DC

## 2021-04-19 MED ORDER — COCONUT OIL OIL: 1.0000 "application " | TOPICAL_OIL | 0 refills | Status: DC | PRN

## 2021-04-19 MED ORDER — IBUPROFEN 100 MG/5ML PO SUSP: 600.0000 mg | Freq: Three times a day (TID) | ORAL | 0 refills | Status: DC | PRN

## 2021-04-19 MED ORDER — ACETAMINOPHEN 160 MG/5ML PO SOLN: 650.0000 mg | Freq: Four times a day (QID) | ORAL | 0 refills | Status: DC | PRN

## 2021-04-19 NOTE — Lactation Note (Signed)
This note was copied from a baby's chart. Lactation Consultation Note  Patient Name: Elizabeth Larsen QQPYP'P Date: 04/19/2021   Age:22 hours P1, ETI female infant. Mom's feeding choice is pump only and bottle feed infant. LC entered room mom and infant asleep at this time.  Maternal Data    Feeding Nipple Type: Slow - flow  LATCH Score                    Lactation Tools Discussed/Used    Interventions    Discharge    Consult Status      Danelle Earthly 04/19/2021, 12:02 AM

## 2021-04-19 NOTE — Lactation Note (Addendum)
This note was copied from a baby's chart. Lactation Consultation Note  Patient Name: Elizabeth Larsen [id number] Date: 04/19/2021 Reason for consult: Follow-up assessment;1st time breastfeeding;Early term 37-38.6wks Age:22 hours, ETI female infant with 3 voids and 3 stools. Mom is pumping only her choice. While LC in room, mom used DEBP and infant was given 10 mls of mom's EBM using slow flow bottle nipple. Mom knows to give infant EBM by hunger cues, 8 to 12+ or more times within 24 hours. Per mom, she chooses not to latch infant at breast, mom has inverted nipples. LC refitted mom with size 27 mm breast flange due mom report pain when pumping with 24 mm breast flange. Mom knows to call RN or LC if she has any breastfeeding questions or concerns. Mom will apply her EBM on nipples and let air dry to help with breast soreness. Mom knows to use DEBP every 3 hours for 15 minutes on initial setting. Mom shown how to use DEBP & how to disassemble, clean, & reassemble parts. Mom made aware of O/P services, breastfeeding support groups, community resources, and our phone # for post-discharge questions.   Maternal Data    Feeding Mother's Current Feeding Choice: Breast Milk  LATCH Score                    Lactation Tools Discussed/Used    Interventions Interventions: Expressed milk;DEBP;Education  Discharge Pump: DEBP;Personal WIC Program: Yes  Consult Status Consult Status: Follow-up Date: 04/19/21 Follow-up type: In-patient    Danelle Earthly 04/19/2021, 2:12 AM

## 2021-04-19 NOTE — Progress Notes (Signed)
POSTPARTUM PROGRESS NOTE  Subjective: Elizabeth Larsen is a 20 y.o. G1P1001 s/p vaginal delivery at [redacted]w[redacted]d.  She reports she doing well. No acute events overnight. She denies any problems with ambulating, voiding or po intake. Denies nausea or vomiting. She has  passed flatus. Pain is well controlled.  Lochia is minimal.  Objective: Blood pressure 94/67, pulse 71, temperature 97.7 F (36.5 C), temperature source Oral, resp. rate 16, height 5\' 6"  (1.676 m), weight 68 kg, last menstrual period 03/02/2020, SpO2 100 %, unknown if currently breastfeeding.  Physical Exam:  General: alert, cooperative and no distress Chest: no respiratory distress Abdomen: soft, non-tender  Uterine Fundus: firm and at level of umbilicus Extremities: No calf swelling or tenderness  no LE edema  Recent Labs    04/17/21 1702  HGB 9.3*  HCT 30.5*    Assessment/Plan: LEXIA VANDEVENDER is a 20 y.o. G1P1001 s/p vaginal delivery at [redacted]w[redacted]d.  Routine Postpartum Care: Doing well, pain well-controlled.  -- Continue routine care, lactation support  -- Contraception: plan for depo prior to discharge -- Feeding: breast  Dispo: Plan for discharge PPD#2.  [redacted]w[redacted]d, MD OB Fellow, [facility] 04/19/2021 9:17 AM

## 2021-04-19 NOTE — Lactation Note (Signed)
This note was copied from a baby's chart. Lactation Consultation Note  Patient Name: Elizabeth Larsen [id number] Date: 04/19/2021 Reason for consult: Follow-up assessment;Primapara;Early term 37-38.6wks Age:22 hours  I provided Mom the hand-out "Formula Feeding Your Baby" to give Mom parameters for bottle feedings.   Mom had question about her nipples when pumping. Mom's milk is coming to volume nicely. I observed her pump for a few minutes & size 27 flanges are appropriate for both breasts.   Mom's nipples are inverted, but the inner portion comes out with pumping and appears tender and extra pink b/c it is getting exposed to the air. I told Mom that when she is done pumping she can use normal saline to clean the area before her nipple re-inverts.    I reassured Mom that if any bleeding were noted from the tender tissue, she could still feed her milk to the baby.   Mom knows how to reach [place] for post-discharge questions.   Lurline Hare [facility] 04/19/2021, 1:50 PM

## 2021-04-19 NOTE — Discharge Instructions (Signed)

## 2021-04-19 NOTE — Discharge Summary (Signed)
Postpartum Discharge Summary   Patient Name: Elizabeth Larsen DOB: [birth year] MRN: [id number]  Date of admission: 04/17/2021 Delivery date:04/18/2021  Delivering provider: Seabron Larsen  Date of discharge: 04/19/2021  Admitting diagnosis: Leakage, amniotic fluid [O42.90] Intrauterine pregnancy: [redacted]w[redacted]d    Secondary diagnosis:  Principal Problem:   Postpartum care following vaginal delivery 5/30 Active Problems:   Leakage, amniotic fluid  Additional problems: None     Discharge diagnosis: Term Pregnancy Delivered                                              Post partum procedures:None Augmentation: Pitocin Complications: None  Hospital course: Onset of Labor With Vaginal Delivery      20y.o. yo G1P1001 at 330w5das admitted in Latent Labor on 04/17/2021. Patient had an uncomplicated labor course as follows:  Membrane Rupture Time/Date: 3:00 PM ,04/17/2021   Delivery Method:Vaginal, Spontaneous  Episiotomy: None  Lacerations:  None  Patient had an uncomplicated postpartum course.  She is ambulating, tolerating a regular diet, passing flatus, and urinating well. Patient is discharged home in stable condition on 04/19/21.  Newborn Data: Birth date:04/18/2021  Birth time:6:57 AM  Gender:Female  Living status:Living  Apgars:8 ,9  Weight:3031 g   Magnesium Sulfate received: No BMZ received: No Rhophylac:N/A MMR:N/A T-DaP:Given postpartum Flu: No Transfusion:No  Physical exam  Vitals:   04/18/21 1344 04/18/21 1739 04/18/21 2148 04/19/21 0457  BP: 101/62 115/75 97/71 94/67   Pulse: 73 68 71 71  Resp: 18 18 16 16   Temp: 98.1 F (36.7 C) 97.9 F (36.6 C) 97.7 F (36.5 C) 97.7 F (36.5 C)  TempSrc: Oral Oral Oral Oral  SpO2:      Weight:      Height:       Exam Performed Day of Discharge by AnGuillermina CityMD   General: alert, cooperative and no distress Chest: no respiratory distress Abdomen: soft, non-tender  Uterine Fundus: firm and at level of  umbilicus Extremities: No calf swelling or tenderness  no LE edema Labs: Lab Results  Component Value Date   WBC 12.7 (H) 04/17/2021   HGB 9.3 (L) 04/17/2021   HCT 30.5 (L) 04/17/2021   MCV 78.6 (L) 04/17/2021   PLT 328 04/17/2021   CMP Latest Ref Rng & Units 07/23/2020  Glucose 65 - 99 mg/dL 85  BUN 6 - 20 mg/dL 5(L)  Creatinine 0.57 - 1.00 mg/dL 0.72  Sodium 134 - 144 mmol/L 141  Potassium 3.5 - 5.2 mmol/L 4.6  Chloride 96 - 106 mmol/L 104  CO2 20 - 29 mmol/L 22  Calcium 8.7 - 10.2 mg/dL 9.3  Total Protein 6.0 - 8.5 g/dL 6.8  Total Bilirubin 0.0 - 1.2 mg/dL 0.3  Alkaline Phos 45 - 106 IU/L 66  AST 0 - 40 IU/L 16  ALT 0 - 32 IU/L 8   Edinburgh Score: Edinburgh Postnatal Depression Scale Screening Tool 04/18/2021  I have been able to laugh and see the funny side of things. 3  I have looked forward with enjoyment to things. 0  I have blamed myself unnecessarily when things went wrong. 0  I have been anxious or worried for no good reason. 2  I have felt scared or panicky for no good reason. 2  Things have been getting on top of me. 1  I have been so unhappy  that I have had difficulty sleeping. 0  I have felt sad or miserable. 0  I have been so unhappy that I have been crying. 0  The thought of harming myself has occurred to me. 0  Edinburgh Postnatal Depression Scale Total 8     After visit meds:  Allergies as of 04/19/2021      Reactions   Shellfish Allergy Other (See Comments)   Tongue started burning and bumps developed on tongue.   Shrimp (diagnostic) Other (See Comments)   Tongue started burning and bumps developed on tongue.      Medication List    STOP taking these medications   Accu-Chek Guide w/Device Kit   cyclobenzaprine 5 MG tablet Commonly known as: FLEXERIL   IRON PO   Precision QID Test test strip Generic drug: glucose blood     TAKE these medications   acetaminophen 160 MG/5ML solution Commonly known as: TYLENOL Take 20.3 mLs (650 mg  total) by mouth every 6 (six) hours as needed for mild pain, moderate pain, fever or headache (for pain scale < 4).   CitraNatal Assure 35-1 & 300 MG tablet One tablet and one capsule daily   coconut oil Oil Apply 1 application topically as needed (nipple pain).   ferrous sulfate 300 (60 Fe) MG/5ML syrup Take 5 mLs (300 mg total) by mouth every other day. Start taking on: April 20, 2021   ibuprofen 100 MG/5ML suspension Commonly known as: ADVIL Take 30 mLs (600 mg total) by mouth every 8 (eight) hours as needed for mild pain or moderate pain.        Discharge home in stable condition Infant Feeding: Breast Infant Disposition:home with mother Discharge instruction: per After Visit Summary and Postpartum booklet. Activity: Advance as tolerated. Pelvic rest for 6 weeks.  Diet: routine diet Future Appointments: Future Appointments  Date Time Provider [place]  05/25/2021 11:30 AM Myrtis Ser, CNM [facility]   Follow up Visit:   Please schedule this patient for a In person postpartum visit in 4 weeks with the following provider: Any provider. Additional Postpartum F/U:Postpartum Depression checkup  Low risk pregnancy complicated by: history of chlamydia, former smoker, PTL given procardia Delivery mode:  Vaginal, Spontaneous  Anticipated Birth Control:  Depo   04/19/2021 Melina Schools, DO

## 2021-04-19 NOTE — Social Work (Signed)
CSW received consult for hx of Anxiety. CSW met with MOB to offer support and complete assessment.    CSW met with MOB at bedside. CSW observed MOB consoling the crying infant with the help and support of FOB. CSW congratulated MOB and FOB. CSW offered MOB privacy. MOB preferred that FOB stay. CSW inquired how MOB has felt since giving birth. MOB explain the birth was scary but she made it and is feeling good. CSW inquired how MOB felt during her pregnancy. MOB explain during her pregnancy she felt like her hormones were up and down. She would often cry and laugh at the same time. MOB reports overall her pregnancy went smooth with no complications.  CSW inquired about MOB history of anxiety. MOB acknowledges she has a history of anxiety. MOB reports she has not been officially diagnosed by anyone, she has taken any medications or been in therapy.  MOB recalls at some point going to the doctor's office last year and talking about feeling anxious.  MOB reports she cannot recall exactly what they talked about. MOB reports her anxiety is triggered by overthinking situations. MOB denies her anxiety interferes with her activities of daily living. SW inquired about MOB coping skills. MOB explain she intentionally tries not to over think and tries not to let stress get the best of her emotions. MOB reports she listens to music to help calm her. She gave an example of her listening to music during the birth to keep her calm. CSW praised MOB coping skills. CSW assessed MOB for safety. MOB denies thoughts of harm to self and others. CSW inquired about MOB supports. MOB acknowledges FOB, grandma, mom and boyfriend grandma as supports.   CSW provided education regarding the baby blues period vs. perinatal mood disorders, discussed treatment and gave resources for mental health follow up. CSW recommended MOB complete a self-evaluation during the postpartum time period using the New Mom Checklist from Postpartum Progress and  encouraged MOB to contact a medical professional if symptoms are noted. CSW answered MOB questions about the resources provided. MOB receptive to the resources provided.   CSW provided review of Sudden Infant Death Syndrome (SIDS) precautions and informed MOB no co-sleeping with the infant. MOB reports the infant will sleep in a bassinet. CSW inquired if MOB has essential items for the infant. MOB reports she has essential items for the infant including a car seat. CSW assessed MOB for additional needs. MOB reports no further need.    CSW identifies no further need for intervention and no barriers to discharge at this time.  Nicole Sinclair, MSW, LCSW [facility]  Clinical Social Worker  [phone] 04/19/2021  9:52 AM 

## 2021-04-20 ENCOUNTER — Encounter: Payer: Medicaid Other | Admitting: Obstetrics & Gynecology

## 2021-04-20 ENCOUNTER — Telehealth: Payer: Self-pay | Admitting: *Deleted

## 2021-04-20 NOTE — Telephone Encounter (Signed)
Transition Care Management Follow-up Telephone Call  Date of discharge and from where: 04/19/2021 - [facility]  How have you been since you were released from the hospital? "Good"  Any questions or concerns? No  Items Reviewed:  Did the pt receive and understand the discharge instructions provided? Yes   Medications obtained and verified? Yes   Other? No   Any new allergies since your discharge? No   Dietary orders reviewed? No  Do you have support at home? Yes    Functional Questionnaire: (I = Independent and D = Dependent) ADLs: I  Bathing/Dressing- I  Meal Prep- I  Eating- I  Maintaining continence- I  Transferring/Ambulation- I  Managing Meds- I  Follow up appointments reviewed:   PCP Hospital f/u appt confirmed? No    Specialist Hospital f/u appt confirmed? Yes  Scheduled to see OBGYN on 05/25/2021 @ 1130.  Are transportation arrangements needed? No   If their condition worsens, is the pt aware to call PCP or go to the Emergency Dept.? Yes  Was the patient provided with contact information for the PCP's office or ED? Yes  Was to pt encouraged to call back with questions or concerns? Yes

## 2021-04-28 ENCOUNTER — Encounter: Payer: Self-pay | Admitting: Adult Health

## 2021-04-28 ENCOUNTER — Other Ambulatory Visit: Payer: Self-pay

## 2021-04-28 ENCOUNTER — Ambulatory Visit (INDEPENDENT_AMBULATORY_CARE_PROVIDER_SITE_OTHER): Payer: Medicaid Other | Admitting: Adult Health

## 2021-04-28 VITALS — BP 105/75 | HR 92 | Ht 66.0 in | Wt 132.0 lb

## 2021-04-28 DIAGNOSIS — N6459 Other signs and symptoms in breast: Secondary | ICD-10-CM | POA: Diagnosis not present

## 2021-04-28 DIAGNOSIS — N61 Mastitis without abscess: Secondary | ICD-10-CM

## 2021-04-28 DIAGNOSIS — N898 Other specified noninflammatory disorders of vagina: Secondary | ICD-10-CM | POA: Diagnosis not present

## 2021-04-28 LAB — POCT WET PREP (WET MOUNT)
Clue Cells Wet Prep Whiff POC: NEGATIVE
WBC, Wet Prep HPF POC: POSITIVE

## 2021-04-28 MED ORDER — DICLOXACILLIN SODIUM 500 MG PO CAPS: 500.0000 mg | ORAL_CAPSULE | Freq: Four times a day (QID) | ORAL | 0 refills | Status: DC

## 2021-04-28 NOTE — Patient Instructions (Signed)
Mastitis  Mastitis is inflammation of the breast tissue. It occurs most often in women who are breastfeeding, but it can also affect other women, and sometimes even men. What are the causes? This condition is usually caused by a bacterial infection. Bacteria enter the breast tissue through cuts or openings in the skin. Typically, this occurs with breastfeeding because of cracked or irritated nipples. Sometimes, it can occur when there is no opening in the skin. This is usually caused by plugged milk ducts. Other causes include:  Nipple piercing.  Some forms of breast cancer. What are the signs or symptoms? Symptoms of this condition include:  Swelling, redness, tenderness, and pain in an area of the breast. The area may also feel warm to the touch. These symptoms usually affect the upper part of the breast, toward the armpit region.  Swelling of the glands under the arm on the same side.  Fever.  Rapid pulse.  Fatigue, headache, and flu-like muscle aches. If an infection is allowed to progress, a collection of pus (abscess) may develop. How is this diagnosed? This condition can usually be diagnosed based on a physical exam and your symptoms. You may also have other tests, such as:  Blood tests to determine if your body is fighting a bacterial infection.  Mammogram or ultrasound tests to rule out other problems or diseases.  Testing of pus and other fluids. Pus from the breast may be collected and examined in the lab. If an abscess has developed, the fluid in the abscess can be removed with a needle. This test can be used to confirm the diagnosis and identify the bacteria present.  If you are breastfeeding, breast milk may be cultured and tested for bacteria. How is this treated? Treatment for this condition may include:  Applying heat or cold compresses to the affected area.  Medicine for pain.  Antibiotic medicine to treat a bacterial infection. This is usually taken by  mouth.  Self-care such as rest and increased fluid intake.  If an abscess has developed, it may be treated by removing fluid with a needle. Mastitis that occurs with breastfeeding will sometimes go away on its own, so your health care provider may choose to wait 24 hours after first seeing you to decide whether a prescription medicine is needed. You may be told of different ways to help manage breastfeeding, such as continuing to breastfeed or pump in order to ensure adequate milk flow. Follow these instructions at home: Medicines  Take over-the-counter and prescription medicines only as told by your health care provider.  If you were prescribed an antibiotic medicine, take it as told by your health care provider. Do not stop taking the antibiotic even if you start to feel better. General instructions  Do not wear a tight or underwire bra. Wear a soft, supportive bra.  Increase your fluid intake, especially if you have a fever.  Get plenty of rest. If you are breastfeeding:  Continue to empty your breasts as often as possible either by breastfeeding or by using a breast pump. This will decrease the pressure and the pain that comes with it. Ask your health care provider if changes need to be made to your breastfeeding or pumping routine.  Keep your nipples clean and dry.  During breastfeeding, empty the first breast completely before going to the other breast. If your baby is not emptying your breasts completely, use a breast pump to empty your breasts.  Use breast massage during feeding or pumping sessions.    If directed, apply moist heat to the affected area of your breast right before breastfeeding or pumping. Use the heat source that your health care provider recommends.  If directed, put ice on the affected area of your breast right after breastfeeding or pumping: ? Put ice in a plastic bag. ? Place a towel between your skin and the bag. ? Leave the ice on for 20 minutes.  If  you go back to work, pump your breasts while at work to stay within your nursing schedule.  Avoid allowing your breasts to become overly filled with milk (engorged). Contact a health care provider if:  You have pus-like discharge from the breast.  You have a fever.  Your symptoms do not improve within 2 days of starting treatment. Get help right away if:  Your pain and swelling are getting worse.  You have pain that is not controlled with medicine.  You have a red line extending from the breast toward your armpit. Summary  Mastitis is inflammation of the breast tissue. It occurs most often in women who are breastfeeding, but it can also affect non-breastfeeding women and some men.  This condition is usually caused by a bacterial infection.  This condition may be treated with hot and cold compresses, medicines, self-care, and certain breastfeeding strategies.  If you were prescribed an antibiotic medicine, take it as told by your health care provider. Do not stop taking the antibiotic even if you start to feel better. This information is not intended to replace advice given to you by your health care provider. Make sure you discuss any questions you have with your health care provider. Document Revised: 10/19/2017 Document Reviewed: 11/28/2016 Elsevier Patient Education  2021 Elsevier Inc.  

## 2021-04-28 NOTE — Progress Notes (Signed)
Patient ID: Elizabeth Larsen, female   DOB: [birth year], 20 y.o.   MRN: [id number] History of Present Illness: Elizabeth Larsen is a 20 year old white female, engaged, G1P1 in complaining of red spot with heat left breast. She had SVD 04/18/21, [place].   Current Medications, Allergies, Past Medical History, Past Surgical History, Family History and Social History were reviewed in [employer] record.     Review of Systems: Has pain and redness left breast Nor getting as much milk +vaginal discharge with odor,no sex yet Reviewed past medical,surgical, social and family history. Reviewed medications and allergies.     Physical Exam:BP 105/75 (BP Location: Right Arm, Patient Position: Sitting, Cuff Size: Normal)   Pulse 92   Ht 5\' 6"  (1.676 m)   Wt 132 lb (59.9 kg)   LMP  (LMP Unknown)   Breastfeeding Yes   BMI 21.31 kg/m   General:  Well developed, well nourished, no acute distress Skin:  Warm and dry Breast:  has red area left breast 7-9 o'clock, tender and warm and both breast engorged, has milk from both nipples  Pelvic:  External genitalia is normal in appearance, no lesions.  The vagina is normal in appearance, with mucous discharge no odor today. Urethra has no lesions or masses. The cervix is bulbous.  Uterus is felt to be involuted and normal shape, and contour.  No adnexal masses or tenderness noted.Bladder is non tender, no masses felt.Wet prep:pos WBC Psych:  No mood changes, alert and cooperative,seems happy EPDS 1   [place] - 04/28/21 1055       Pregnancy Intention Screening   Does the patient want to become pregnant in the next year? No    Does the patient's partner want to become pregnant in the next year? No    Would the patient like to discuss contraceptive options today? No      Contraception Wrap Up   Current Method Abstinence    End Method Abstinence            Examination chaperoned by 06/28/21 LPN   Impression and  Plan: 1. Mastitis Use warm compress or shower, massage breast,pump more will rx dicloxacillin  Meds ordered this encounter  Medications   dicloxacillin (DYNAPEN) 500 MG capsule    Sig: Take 1 capsule (500 mg total) by mouth 4 (four) times daily.    Dispense:  28 capsule    Refill:  0    Order Specific Question:   Supervising Provider    Answer:   Faith Rogue [[id number]]   Review handouts   2. Engorgement of breast Pump more frequently   3. Vaginal discharge Looks normal   Has postpartum visit 05/25/21

## 2021-05-24 ENCOUNTER — Encounter: Payer: Self-pay | Admitting: *Deleted

## 2021-05-25 ENCOUNTER — Ambulatory Visit (INDEPENDENT_AMBULATORY_CARE_PROVIDER_SITE_OTHER): Payer: Medicaid Other | Admitting: Advanced Practice Midwife

## 2021-05-25 ENCOUNTER — Other Ambulatory Visit: Payer: Self-pay

## 2021-05-25 ENCOUNTER — Encounter: Payer: Self-pay | Admitting: Advanced Practice Midwife

## 2021-05-25 MED ORDER — FLUCONAZOLE 150 MG PO TABS: 150.0000 mg | ORAL_TABLET | Freq: Once | ORAL | 0 refills | Status: AC

## 2021-05-25 MED ORDER — MEDROXYPROGESTERONE ACETATE 150 MG/ML IM SUSP: 150.0000 mg | INTRAMUSCULAR | 4 refills | Status: DC

## 2021-05-25 NOTE — Progress Notes (Signed)
POSTPARTUM VISIT Patient name: Elizabeth Larsen MRN [id number]  Date of birth: 02-17-2001 Chief Complaint:   Postpartum Care  History of Present Illness:   Elizabeth Larsen is a 20 y.o. G71P1001 Caucasian female being seen today for a postpartum visit. She is 5 weeks postpartum following a spontaneous vaginal delivery at 38.5 gestational weeks. IOL: no  Anesthesia: epidural.  Laceration: none.  Complications: none. Inpatient contraception: yes Depo given on 04/19/21 .   Pregnancy uncomplicated. Tobacco use: former . Substance use disorder: no. Last pap smear: Oct 2021 and results were NILM w/ HRHPV not done. Next pap smear due: Oct 2024 No LMP recorded (lmp unknown). (Menstrual status: Lactating).  Postpartum course has been complicated by Mastitis on 04/28/2021; took diclox . Bleeding none. Bowel function is normal. Bladder function is normal. Urinary incontinence? no, fecal incontinence? no Patient is sexually active. Last sexual activity:  1-2wk ago . Desired contraception: Depo. Patient does not know about a pregnancy in the future.  Desired family size is unsure number of children.   The pregnancy intention screening data noted above was reviewed. Potential methods of contraception were discussed. The patient elected to proceed with Hormonal Injection.   Edinburgh Postpartum Depression Screening: negative  Edinburgh Postnatal Depression Scale - 05/25/21 1147       Edinburgh Postnatal Depression Scale:  In the Past 7 Days   I have been able to laugh and see the funny side of things. 0    I have looked forward with enjoyment to things. 0    I have blamed myself unnecessarily when things went wrong. 0    I have been anxious or worried for no good reason. 1    I have felt scared or panicky for no good reason. 0    Things have been getting on top of me. 0    I have been so unhappy that I have had difficulty sleeping. 0    I have felt sad or miserable. 0    I have been so unhappy  that I have been crying. 0    The thought of harming myself has occurred to me. 0    Edinburgh Postnatal Depression Scale Total 1             GAD 7 : Generalized Anxiety Score 01/27/2021 10/13/2020 09/07/2020 06/08/2020  Nervous, Anxious, on Edge 0 0 0 1  Control/stop worrying 0 0 0 2  Worry too much - different things 0 0 0 3  Trouble relaxing 0 0 0 0  Restless 0 0 0 0  Easily annoyed or irritable 0 0 0 1  Afraid - awful might happen 0 0 0 1  Total GAD 7 Score 0 0 0 8     Baby's course has been uncomplicated. Baby is feeding by bottle. Infant has a pediatrician/family doctor? Yes.  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  Pt has material needs met for her and baby: Yes.   Review of Systems:   Pertinent items are noted in HPI Denies Abnormal vaginal discharge w/ itching/odor/irritation, headaches, visual changes, shortness of breath, chest pain, abdominal pain, severe nausea/vomiting, or problems with urination or bowel movements. Pertinent History Reviewed:  Reviewed past medical,surgical, obstetrical and family history.  Reviewed problem list, medications and allergies. OB History  Gravida Para Term Preterm AB Living  1 1 1  0 0 1  SAB IAB  Ectopic Multiple Live Births  0 0 0 0 1    # Outcome Date GA Lbr Len/2nd Weight Sex Delivery Anes PTL Lv  1 Term 04/18/21 [id number]:55 / 01:07 6 lb 10.9 oz (3.031 kg) F Vag-Spont EPI  LIV   Physical Assessment:   Vitals:   05/25/21 1140  BP: 109/75  Pulse: 78  Weight: 133 lb (60.3 kg)  Height: 5' 6"  (1.676 m)  Body mass index is 21.47 kg/m.       Physical Examination:   General appearance: alert, well appearing, and in no distress  Mental status: alert, oriented to person, place, and time  Skin: warm & dry   Cardiovascular: normal heart rate noted   Respiratory: normal respiratory effort, no distress   Breasts: deferred, no complaints    Abdomen: soft, non-tender   Pelvic: examination not indicated. Thin prep pap obtained: No  Rectal: not examined  Extremities: Edema: none         No results found for this or any previous visit (from the past 24 hour(s)).  Assessment & Plan:  1) Postpartum exam 2) Five wks s/p spontaneous vaginal delivery 3) bottle feeding 4) Depression screening 5) Contraception management: rx next Depo to be ~ end of Aug 6) Vag itching: rx Diflucan 1525mx 1  Essential components of care per ACOG recommendations:  1.  Mood and well being:  If positive depression screen, discussed and plan developed.  If using tobacco we discussed reduction/cessation and risk of relapse If current substance abuse, we discussed and referral to local resources was offered.   2. Infant care and feeding:  If breastfeeding, discussed returning to work, pumping, breastfeeding-associated pain, guidance regarding return to fertility while lactating if not using another method. If needed, patient was provided with a letter to be allowed to pump q 2-3hrs to support lactation in a private location with access to a refrigerator to store breastmilk.   Recommended that all caregivers be immunized for flu, pertussis and other preventable communicable diseases If pt does not have material needs met for her/baby, referred to local resources for help obtaining these.  3. Sexuality, contraception and birth spacing Provided guidance regarding sexuality, management of dyspareunia, and resumption of intercourse Discussed avoiding interpregnancy interval <34m40m and recommended birth spacing of 18 months  4. Sleep and fatigue Discussed coping options for fatigue and sleep disruption Encouraged family/partner/community support of 4 hrs of uninterrupted sleep to help with mood and fatigue  5. Physical recovery  If pt had a C/S, assessed incisional pain and providing guidance on normal vs prolonged recovery If pt had a laceration,  perineal healing and pain reviewed.  If urinary or fecal incontinence, discussed management and referred to PT or uro/gyn if indicated  Patient is safe to resume physical activity. Discussed attainment of healthy weight.  6.  Chronic disease management Discussed pregnancy complications if any, and their implications for future childbearing and long-term maternal health. Review recommendations for prevention of recurrent pregnancy complications, such as 17 hydroxyprogesterone caproate to reduce risk for recurrent PTB not applicable, or aspirin to reduce risk of preeclampsia not applicable. Pt had GDM: no. If yes, 2hr GTT scheduled: not applicable. Reviewed medications and non-pregnant dosing including consideration of whether pt is breastfeeding using a reliable resource such as LactMed: not applicable Referred for f/u w/ PCP or subspecialist providers as indicated: not applicable  7. Health maintenance Mammogram at 40y79yo earlier if indicated Pap smears as indicated  Meds:  Meds ordered this encounter  Medications   fluconazole (DIFLUCAN) 150 MG tablet    Sig: Take 1 tablet (150 mg total) by mouth once for 1 dose.    Dispense:  1 tablet    Refill:  0    Order Specific Question:   Supervising Provider    Answer:   Elonda Husky, LUTHER H [[id number]]   medroxyPROGESTERone (DEPO-PROVERA) 150 MG/ML injection    Sig: Inject 1 mL (150 mg total) into the muscle every 3 (three) months.    Dispense:  1 mL    Refill:  4    Order Specific Question:   Supervising Provider    Answer:   Florian Buff [[id number]]    Follow-up: Return in about 1 year (around 05/25/2022) for Depo injection every 3 months (last dose 5/31/220; physical 1 year.   No orders of the defined types were placed in this encounter.   Myrtis Ser CNM 05/25/2021 12:09 PM

## 2021-05-31 ENCOUNTER — Telehealth: Payer: Self-pay | Admitting: Obstetrics & Gynecology

## 2021-05-31 NOTE — Telephone Encounter (Signed)
Pt called, states she dropped off a physical form for her work about a month ago & has not received a call stating it's ready  Can you look in your stuff to see if you have anything for her?  I didn't see anything up here or in her media tab

## 2021-06-01 DIAGNOSIS — E162 Hypoglycemia, unspecified: Secondary | ICD-10-CM | POA: Diagnosis not present

## 2021-06-15 DIAGNOSIS — E162 Hypoglycemia, unspecified: Secondary | ICD-10-CM | POA: Diagnosis not present

## 2021-06-21 ENCOUNTER — Encounter: Payer: Medicaid Other | Admitting: Internal Medicine

## 2021-06-21 NOTE — Progress Notes (Deleted)
   CC: ***  HPI:Ms.TRAVONNA SWINDLE is a 20 y.o. female who presents for evaluation of ***. Please see individual problem based A/P for details.  Hypoglycemia without diabetes.  Assessment: Follows with Dr. Smith Robert endocrinology  Plan: ***  ***  Assessment: ***  Plan: ***  ***  Assessment: ***  Plan: ***   Depression, PHQ-9: Based on the patients  Flowsheet Row Routine Prenatal from 01/27/2021 in [facility]  PHQ-9 Total Score 3      score we have ***.  Past Medical History:  Diagnosis Date   Chlamydia 08/03/2020   Treated 08/03/20 PCO________   History of enlarged tonsils    Hypoglycemia    Review of Systems:   ROS   Physical Exam: There were no vitals filed for this visit.   General: *** HEENT: Conjunctiva nl , antiicteric sclerae, moist mucous membranes, no exudate or erythema Cardiovascular: Normal rate, regular rhythm.  No murmurs, rubs, or gallops Pulmonary : Equal breath sounds, No wheezes, rales, or rhonchi Abdominal: soft, nontender,  bowel sounds present Ext: No edema in lower extremities, no tenderness to palpation of lower extremities.   Assessment & Plan:   See Encounters Tab for problem based charting.  Patient {GC/GE:[id number]::"discussed with","seen with"} Dr. {CWCBJ:[id number]::"V. Hoffman","Guilloud","Mullen","Narendra","Raines","Vincent","Williams"}

## 2021-07-11 ENCOUNTER — Other Ambulatory Visit: Payer: Self-pay

## 2021-07-11 ENCOUNTER — Ambulatory Visit (INDEPENDENT_AMBULATORY_CARE_PROVIDER_SITE_OTHER): Payer: Medicaid Other

## 2021-07-11 DIAGNOSIS — Z3042 Encounter for surveillance of injectable contraceptive: Secondary | ICD-10-CM | POA: Diagnosis not present

## 2021-07-11 MED ORDER — MEDROXYPROGESTERONE ACETATE 150 MG/ML IM SUSP
150.0000 mg | Freq: Once | INTRAMUSCULAR | Status: AC
  Administered 2021-07-11: 150 mg via INTRAMUSCULAR

## 2021-07-11 NOTE — Progress Notes (Signed)
   NURSE VISIT- INJECTION  SUBJECTIVE:  Elizabeth Larsen is a 20 y.o. G108P1001 female here for a Depo Provera for contraception/period management. She is postpartum.   OBJECTIVE:  There were no vitals taken for this visit.  Appears well, in no apparent distress  Injection administered in: Left deltoid  Meds ordered this encounter  Medications   medroxyPROGESTERone (DEPO-PROVERA) injection 150 mg    ASSESSMENT: Postpartum Depo Provera for contraception/period management PLAN: Follow-up: in 11-13 weeks for next Depo   [name] A Matteus Mcnelly  07/11/2021 2:45 PM

## 2021-07-21 ENCOUNTER — Encounter: Payer: Medicaid Other | Admitting: Internal Medicine

## 2021-07-24 DIAGNOSIS — J029 Acute pharyngitis, unspecified: Secondary | ICD-10-CM | POA: Diagnosis not present

## 2021-07-24 DIAGNOSIS — Z20822 Contact with and (suspected) exposure to covid-19: Secondary | ICD-10-CM | POA: Diagnosis not present

## 2021-07-26 DIAGNOSIS — E162 Hypoglycemia, unspecified: Secondary | ICD-10-CM | POA: Diagnosis not present

## 2021-08-08 ENCOUNTER — Encounter: Payer: Medicaid Other | Admitting: Internal Medicine

## 2021-08-11 ENCOUNTER — Encounter: Payer: Medicaid Other | Admitting: Internal Medicine

## 2021-08-12 DIAGNOSIS — H6691 Otitis media, unspecified, right ear: Secondary | ICD-10-CM | POA: Diagnosis not present

## 2021-08-15 ENCOUNTER — Ambulatory Visit: Payer: Medicaid Other | Admitting: Internal Medicine

## 2021-08-15 ENCOUNTER — Encounter: Payer: Self-pay | Admitting: Internal Medicine

## 2021-08-15 ENCOUNTER — Other Ambulatory Visit: Payer: Self-pay

## 2021-08-15 DIAGNOSIS — Z0000 Encounter for general adult medical examination without abnormal findings: Secondary | ICD-10-CM | POA: Insufficient documentation

## 2021-08-15 NOTE — Patient Instructions (Signed)
Dear Elizabeth Larsen,  Today we did an annual checkup. You are doing well overall and have no complaints today.  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  We will see back in 1 year for another annual checkup.  If you have any new complaints in the meantime please call us for an appointment.

## 2021-08-15 NOTE — Assessment & Plan Note (Addendum)
Patient seen and evaluated for routine medical examination. She reports that she has been doing well. No complaints today. Denies any fever, chills, nausea, vomiting, diarrhea, abdominal pain, weakness, fatigue, feelings of sadness or depression.  Not currently taking any medications.  Patient denies any alcohol tobacco or illicit drug use.  She is not due for Pap smear.  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  Paperwork filled out and copy scanned into records.

## 2021-08-15 NOTE — Progress Notes (Signed)
   CC: Work paperwork and checkup  HPI:[name] is a 20 y.o. female who presents for evaluation of checkup and work paperwork. Please see individual problem based A/P for details.  Please see encounters tab for problem-based charting.  Problem List Items Addressed This Visit       Other   Routine general medical examination at a health care facility    Patient seen and evaluated for routine medical examination. She reports that she has been doing well. No complaints today. Denies any fever, chills, nausea, vomiting, diarrhea, abdominal pain, weakness, fatigue, feelings of sadness or depression.  Not currently taking any medications.  Patient denies any alcohol tobacco or illicit drug use.  She is not due for Pap smear.  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  Paperwork filled out and copy scanned into records.         Depression, PHQ-9: Based on the patients  Flowsheet Row Office Visit from 08/15/2021 in [facility]  PHQ-9 Total Score 0      score we have 0.  Past Medical History:  Diagnosis Date   Chlamydia 08/03/2020   Treated 08/03/20 PCO________   History of enlarged tonsils    Hypoglycemia    Review of Systems:   Review of Systems  Constitutional: Negative.   HENT: Negative.    Eyes: Negative.   Respiratory: Negative.    Cardiovascular: Negative.   Gastrointestinal: Negative.   Genitourinary: Negative.   Musculoskeletal: Negative.   Skin: Negative.   Neurological: Negative.   Endo/Heme/Allergies: Negative.   Psychiatric/Behavioral: Negative.      Physical Exam: Vitals:   08/15/21 1451  BP: 108/61  Pulse: 73  Temp: 98.7 F (37.1 C)  TempSrc: Oral  SpO2: 95%  Weight: 136 lb 9.6 oz (62 kg)  Height: 5\' 6"  (1.676 m)     General: Alert and oriented no acute distress HEENT: Conjunctiva nl , antiicteric sclerae, moist mucous membranes, no exudate or  erythema Cardiovascular: Normal rate, regular rhythm.  No murmurs, rubs, or gallops Pulmonary : Equal breath sounds, No wheezes, rales, or rhonchi Abdominal: soft, nontender,  bowel sounds present Ext: No edema in lower extremities, no tenderness to palpation of lower extremities.   Assessment & Plan:   See Encounters Tab for problem based charting.  Patient discussed with Dr. 

## 2021-08-16 NOTE — Progress Notes (Signed)
Internal Medicine Clinic Attending ° °Case discussed with Dr. Gawaluck  At the time of the visit.  We reviewed the resident’s history and exam and pertinent patient test results.  I agree with the assessment, diagnosis, and plan of care documented in the resident’s note.  °

## 2021-08-30 DIAGNOSIS — J329 Chronic sinusitis, unspecified: Secondary | ICD-10-CM | POA: Diagnosis not present

## 2021-08-30 DIAGNOSIS — R067 Sneezing: Secondary | ICD-10-CM | POA: Diagnosis not present

## 2021-08-30 DIAGNOSIS — H9209 Otalgia, unspecified ear: Secondary | ICD-10-CM | POA: Diagnosis not present

## 2021-08-30 DIAGNOSIS — J3489 Other specified disorders of nose and nasal sinuses: Secondary | ICD-10-CM | POA: Diagnosis not present

## 2021-09-05 DIAGNOSIS — B001 Herpesviral vesicular dermatitis: Secondary | ICD-10-CM | POA: Diagnosis not present

## 2021-10-04 ENCOUNTER — Other Ambulatory Visit: Payer: Self-pay

## 2021-10-05 ENCOUNTER — Ambulatory Visit: Payer: Medicaid Other | Admitting: *Deleted

## 2021-10-05 DIAGNOSIS — Z3042 Encounter for surveillance of injectable contraceptive: Secondary | ICD-10-CM | POA: Diagnosis not present

## 2021-10-05 DIAGNOSIS — Z20822 Contact with and (suspected) exposure to covid-19: Secondary | ICD-10-CM | POA: Diagnosis not present

## 2021-10-05 MED ORDER — MEDROXYPROGESTERONE ACETATE 150 MG/ML IM SUSP
150.0000 mg | Freq: Once | INTRAMUSCULAR | Status: AC
  Administered 2021-10-05: 150 mg via INTRAMUSCULAR

## 2021-10-05 NOTE — Progress Notes (Signed)
   NURSE VISIT- INJECTION  SUBJECTIVE:  Elizabeth Larsen is a 20 y.o. G78P1001 female here for a Depo Provera for contraception/period management. She is a GYN patient.   OBJECTIVE:  There were no vitals taken for this visit.  Appears well, in no apparent distress  Injection administered in: Right deltoid  Meds ordered this encounter  Medications   medroxyPROGESTERone (DEPO-PROVERA) injection 150 mg    ASSESSMENT: GYN patient Depo Provera for contraception/period management PLAN: Follow-up: in 11-13 weeks for next Depo   Jobe Marker  10/05/2021 3:52 PM

## 2021-10-08 DIAGNOSIS — Z20822 Contact with and (suspected) exposure to covid-19: Secondary | ICD-10-CM | POA: Diagnosis not present

## 2021-10-10 DIAGNOSIS — J029 Acute pharyngitis, unspecified: Secondary | ICD-10-CM | POA: Diagnosis not present

## 2021-11-01 ENCOUNTER — Encounter: Payer: Self-pay | Admitting: Women's Health

## 2021-11-29 DIAGNOSIS — Z20822 Contact with and (suspected) exposure to covid-19: Secondary | ICD-10-CM | POA: Diagnosis not present

## 2021-12-12 NOTE — Progress Notes (Deleted)
Erroneous encounter. Appt canceled, patient did not bring depo

## 2021-12-25 DIAGNOSIS — B09 Unspecified viral infection characterized by skin and mucous membrane lesions: Secondary | ICD-10-CM | POA: Diagnosis not present

## 2021-12-25 DIAGNOSIS — Z91013 Allergy to seafood: Secondary | ICD-10-CM | POA: Diagnosis not present

## 2021-12-25 DIAGNOSIS — Z87891 Personal history of nicotine dependence: Secondary | ICD-10-CM | POA: Diagnosis not present

## 2021-12-25 DIAGNOSIS — K529 Noninfective gastroenteritis and colitis, unspecified: Secondary | ICD-10-CM | POA: Diagnosis not present

## 2021-12-25 DIAGNOSIS — R21 Rash and other nonspecific skin eruption: Secondary | ICD-10-CM | POA: Diagnosis not present

## 2021-12-26 ENCOUNTER — Telehealth: Payer: Self-pay | Admitting: *Deleted

## 2021-12-26 NOTE — Telephone Encounter (Signed)
Transition Care Management Follow-up Telephone Call Date of discharge and from where: 12/25/2020 [facility] ED How have you been since you were released from the hospital? "I am fine" Any questions or concerns? No  Items Reviewed: Did the pt receive and understand the discharge instructions provided? Yes  Medications obtained and verified?  N/A Other? No  Any new allergies since your discharge? No  Dietary orders reviewed? No Do you have support at home? Yes    Functional Questionnaire: (I = Independent and D = Dependent) ADLs: I  Bathing/Dressing- I  Meal Prep- I  Eating- I  Maintaining continence- I  Transferring/Ambulation- I  Managing Meds- I  Follow up appointments reviewed:  PCP Hospital f/u appt confirmed? No   Specialist Hospital f/u appt confirmed? No   Are transportation arrangements needed? No  If their condition worsens, is the pt aware to call PCP or go to the Emergency Dept.? Yes Was the patient provided with contact information for the PCP's office or ED? Yes Was to pt encouraged to call back with questions or concerns? Yes

## 2021-12-28 ENCOUNTER — Other Ambulatory Visit: Payer: Self-pay

## 2021-12-28 ENCOUNTER — Ambulatory Visit (INDEPENDENT_AMBULATORY_CARE_PROVIDER_SITE_OTHER): Payer: Medicaid Other | Admitting: *Deleted

## 2021-12-28 DIAGNOSIS — Z3042 Encounter for surveillance of injectable contraceptive: Secondary | ICD-10-CM | POA: Diagnosis not present

## 2021-12-28 MED ORDER — MEDROXYPROGESTERONE ACETATE 150 MG/ML IM SUSP
150.0000 mg | Freq: Once | INTRAMUSCULAR | Status: AC
  Administered 2021-12-28: 150 mg via INTRAMUSCULAR

## 2021-12-28 NOTE — Progress Notes (Signed)
° °  NURSE VISIT- INJECTION  SUBJECTIVE:  Elizabeth Larsen is a 21 y.o. G86P1001 female here for a Depo Provera for contraception/period management. She is a GYN patient.   OBJECTIVE:  There were no vitals taken for this visit.  Appears well, in no apparent distress  Injection administered in: Left deltoid  Meds ordered this encounter  Medications   medroxyPROGESTERone (DEPO-PROVERA) injection 150 mg    ASSESSMENT: GYN patient Depo Provera for contraception/period management PLAN: Follow-up: in 11-13 weeks for next Depo   Annamarie Dawley  12/28/2021 3:39 PM

## 2022-01-16 DIAGNOSIS — J029 Acute pharyngitis, unspecified: Secondary | ICD-10-CM | POA: Diagnosis not present

## 2022-01-16 DIAGNOSIS — H9203 Otalgia, bilateral: Secondary | ICD-10-CM | POA: Diagnosis not present

## 2022-02-11 ENCOUNTER — Encounter: Payer: Self-pay | Admitting: Women's Health

## 2022-03-20 ENCOUNTER — Ambulatory Visit (INDEPENDENT_AMBULATORY_CARE_PROVIDER_SITE_OTHER): Payer: Medicaid Other | Admitting: *Deleted

## 2022-03-20 DIAGNOSIS — Z3042 Encounter for surveillance of injectable contraceptive: Secondary | ICD-10-CM

## 2022-03-20 DIAGNOSIS — M791 Myalgia, unspecified site: Secondary | ICD-10-CM | POA: Diagnosis not present

## 2022-03-20 DIAGNOSIS — R070 Pain in throat: Secondary | ICD-10-CM | POA: Diagnosis not present

## 2022-03-20 DIAGNOSIS — Z20822 Contact with and (suspected) exposure to covid-19: Secondary | ICD-10-CM | POA: Diagnosis not present

## 2022-03-20 MED ORDER — MEDROXYPROGESTERONE ACETATE 150 MG/ML IM SUSP
150.0000 mg | Freq: Once | INTRAMUSCULAR | Status: AC
  Administered 2022-03-20: 150 mg via INTRAMUSCULAR

## 2022-03-20 NOTE — Progress Notes (Signed)
? ?  NURSE VISIT- INJECTION ? ?SUBJECTIVE:  ?Elizabeth Larsen is a 21 y.o. G44P1001 female here for a Depo Provera for contraception/period management. She is a GYN patient.  ? ?OBJECTIVE:  ?There were no vitals taken for this visit.  ?Appears well, in no apparent distress ? ?Injection administered in: Right deltoid ? ?Meds ordered this encounter  ?Medications  ? medroxyPROGESTERone (DEPO-PROVERA) injection 150 mg  ? ? ?ASSESSMENT: ?GYN patient Depo Provera for contraception/period management ?PLAN: ?Follow-up: in 11-13 weeks for next Depo & Physical  ? ?Malachy Mood  ?03/20/2022 ?4:25 PM ? ?

## 2022-03-24 DIAGNOSIS — J039 Acute tonsillitis, unspecified: Secondary | ICD-10-CM | POA: Diagnosis not present

## 2022-03-27 DIAGNOSIS — H10233 Serous conjunctivitis, except viral, bilateral: Secondary | ICD-10-CM | POA: Diagnosis not present

## 2022-03-27 DIAGNOSIS — J0100 Acute maxillary sinusitis, unspecified: Secondary | ICD-10-CM | POA: Diagnosis not present

## 2022-04-18 ENCOUNTER — Encounter: Payer: Self-pay | Admitting: Women's Health

## 2022-04-23 ENCOUNTER — Ambulatory Visit
Admission: EM | Admit: 2022-04-23 | Discharge: 2022-04-23 | Disposition: A | Discharge status: Home / Self Care | Payer: Medicaid Other | Attending: Family Medicine | Admitting: Family Medicine

## 2022-04-23 ENCOUNTER — Ambulatory Visit (INDEPENDENT_AMBULATORY_CARE_PROVIDER_SITE_OTHER): Payer: Medicaid Other

## 2022-04-23 DIAGNOSIS — M25571 Pain in right ankle and joints of right foot: Secondary | ICD-10-CM | POA: Diagnosis not present

## 2022-04-23 DIAGNOSIS — S92901A Unspecified fracture of right foot, initial encounter for closed fracture: Secondary | ICD-10-CM | POA: Diagnosis not present

## 2022-04-23 NOTE — ED Provider Notes (Signed)
[facility]    CSN: [id number] Arrival date & time: 04/23/22  1048      History   Chief Complaint Chief Complaint  Patient presents with   Ankle Pain    Twisted her right ankle from fall last night    HPI Elizabeth Larsen is a 21 y.o. female.   Presenting today with right lateral ankle pain, swelling, inability to bear weight on the ankle since falling down some stairs yesterday and twisting it.  Denies numbness, tingling, discoloration, loss of range of motion of the foot.  Taking over-the-counter pain relievers and using ice with minimal relief.  No past history of orthopedic issues to the area.   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 Authorizing Provider  medroxyPROGESTERone (DEPO-PROVERA) 150 MG/ML injection Inject 1 mL (150 mg total) into the muscle every 3 (three) months. 05/25/21   [name], CNM    Family History Family History  Problem Relation Age of Onset   Heart attack Father    Polycystic kidney disease Sister     Social History Social History   Tobacco Use   Smoking status: Former    Types: E-cigarettes   Smokeless tobacco: Never   Tobacco comments:    Conservation officer, nature Use:  Former   Substances: Nicotine, Flavoring  Substance Use Topics   Alcohol use: Not Currently    Comment: Occasional   Drug use: Never     Allergies   Shellfish allergy and Shrimp (diagnostic)   Review of Systems Review of Systems Per HPI  Physical Exam Triage Vital Signs ED Triage Vitals [04/23/22 1122]  Enc Vitals Group     BP 125/85     Pulse Rate (!) 107     Resp 20     Temp 97.9 F (36.6 C)     Temp Source Oral     SpO2 98 %     Weight      Height      Head Circumference      Peak Flow      Pain Score 7     Pain Loc      Pain Edu?      Excl. in GC?    No data found.  Updated Vital Signs BP 125/85 (BP Location: Right Arm)   Pulse (!) 107   Temp 97.9 F (36.6 C) (Oral)   Resp 20   SpO2 98%   Breastfeeding  No   Visual Acuity Right Eye Distance:   Left Eye Distance:   Bilateral Distance:    Right Eye Near:   Left Eye Near:    Bilateral Near:     Physical Exam Vitals and nursing note reviewed.  Constitutional:      Appearance: Normal appearance. She is not ill-appearing.  HENT:     Head: Atraumatic.  Eyes:     Extraocular Movements: Extraocular movements intact.     Conjunctiva/sclera: Conjunctivae normal.  Cardiovascular:     Rate and Rhythm: Normal rate and regular rhythm.     Heart sounds: Normal heart sounds.  Pulmonary:     Effort: Pulmonary effort is normal.     Breath sounds: Normal breath sounds.  Musculoskeletal:        General: Swelling, tenderness and signs of injury present. No deformity.     Cervical back: Normal range of motion and neck supple.     Comments: Range of motion intact in the right foot but minimal range of motion in the right ankle.  Unable to bear weight on the ankle.  Significant tenderness to palpation surrounding lateral malleolus of the right ankle  Skin:    General: Skin is warm and dry.     Findings: No bruising or erythema.  Neurological:     Mental Status: She is alert and oriented to person, place, and  time.     Comments: Right lower extremity neurovascularly intact  Psychiatric:        Mood and Affect: Mood normal.        Thought Content: Thought content normal.        Judgment: Judgment normal.     UC Treatments / Results  Labs (all labs ordered are listed, but only abnormal results are displayed) Labs Reviewed - No data to display  EKG   Radiology DG Ankle Complete Right  Result Date: 04/23/2022 CLINICAL DATA:  Right ankle pain after twisting injury EXAM: RIGHT ANKLE - COMPLETE 3+ VIEW COMPARISON:  None Available. FINDINGS: Small fleck cortical bone along the lateral aspect of the midfoot seen on AP view suspicious for an avulsion fracture related to the extensor digitorum brevis muscle attachment. No additional fractures are seen. Ankle mortise is congruent. Mild soft tissue swelling of the lateral foot. No ankle swelling. IMPRESSION: 1. Small fleck cortical bone along the lateral aspect of the midfoot suspicious for an avulsion fracture related to the extensor digitorum brevis muscle attachment. 2. Mild soft tissue swelling of the lateral foot. Electronically Signed   By: Duanne GuessNicholas  Plundo D.O.   On: 04/23/2022 11:53    Procedures Procedures (including critical care time)  Medications Ordered in UC Medications - No data to display  Initial Impression / Assessment and Plan / UC Course  I have reviewed the triage vital signs and the nursing notes.  Pertinent labs & imaging results that were available during my care of the patient were reviewed by me and considered in my medical decision making (see chart for details).     X-ray of the right ankle showing an avulsion fracture to the lateral foot.  Will place in a cam boot and give crutches, remain nonweightbearing until orthopedics can follow-up.  Work note given.  Final Clinical Impressions(s) / UC Diagnoses   Final diagnoses:  Closed fracture of right foot, initial encounter   Discharge Instructions   None    ED  Prescriptions   None    PDMP not reviewed this encounter.   Maurice MarchLane,  Salley Hews, PA-C 04/23/22 1226

## 2022-04-23 NOTE — ED Triage Notes (Signed)
Pt states she was going down some steps that were wet and she slipped and twisted her right ankle  Pt states her ankle hurts when she puts pressure on it to walk

## 2022-04-24 DIAGNOSIS — S92901A Unspecified fracture of right foot, initial encounter for closed fracture: Secondary | ICD-10-CM | POA: Diagnosis not present

## 2022-05-15 DIAGNOSIS — S92901A Unspecified fracture of right foot, initial encounter for closed fracture: Secondary | ICD-10-CM | POA: Diagnosis not present

## 2022-06-12 ENCOUNTER — Other Ambulatory Visit (HOSPITAL_COMMUNITY)
Admission: RE | Admit: 2022-06-12 | Discharge: 2022-06-12 | Disposition: A | Discharge status: Home / Self Care | Payer: Medicaid Other | Source: Ambulatory Visit | Attending: Women's Health | Admitting: Women's Health

## 2022-06-12 ENCOUNTER — Encounter: Payer: Self-pay | Admitting: Women's Health

## 2022-06-12 ENCOUNTER — Ambulatory Visit (INDEPENDENT_AMBULATORY_CARE_PROVIDER_SITE_OTHER): Payer: Medicaid Other | Admitting: Women's Health

## 2022-06-12 VITALS — BP 127/81 | HR 71 | Ht 66.0 in | Wt 136.0 lb

## 2022-06-12 DIAGNOSIS — N644 Mastodynia: Secondary | ICD-10-CM | POA: Diagnosis not present

## 2022-06-12 DIAGNOSIS — Z113 Encounter for screening for infections with a predominantly sexual mode of transmission: Secondary | ICD-10-CM

## 2022-06-12 DIAGNOSIS — Z0000 Encounter for general adult medical examination without abnormal findings: Secondary | ICD-10-CM

## 2022-06-12 DIAGNOSIS — Z3042 Encounter for surveillance of injectable contraceptive: Secondary | ICD-10-CM | POA: Diagnosis not present

## 2022-06-12 DIAGNOSIS — Z01419 Encounter for gynecological examination (general) (routine) without abnormal findings: Secondary | ICD-10-CM | POA: Diagnosis not present

## 2022-06-12 MED ORDER — MEDROXYPROGESTERONE ACETATE 150 MG/ML IM SUSP: 150.0000 mg | INTRAMUSCULAR | 4 refills | Status: DC

## 2022-06-12 NOTE — Addendum Note (Signed)
Addended by: Moss Mc on: 06/12/2022 02:07 PM   Modules accepted: Orders

## 2022-06-12 NOTE — Progress Notes (Signed)
WELL-WOMAN EXAMINATION Patient name: Elizabeth Larsen MRN [id number]  Date of birth: Apr 03, 2001 Chief Complaint:   Annual Exam  History of Present Illness:   Elizabeth Larsen is a 21 y.o. G18P1001 Caucasian female being seen today for a routine well-woman exam.  Current complaints: intermittent Lt breast pain <8yr. Whole breast will ache all day, then goes away. Happens about q 2wks, no correlation w/ caffeine, etc she can see. 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   PCP: none      does not desire labs No LMP recorded. Patient has had an injection. The current method of family planning is Depo-Provera injections.  Last pap 09/07/20. Results were: NILM w/ HRHPV not done. H/O abnormal pap: no Last mammogram: never, did have breast u/s March 2022 w/ biopsy, fibroadenoma. Family h/o breast cancer: no Last colonoscopy: never. Results were: N/A. Family h/o colorectal cancer: no     06/12/2022   11:25 AM 08/15/2021    2:50 PM 01/27/2021    9:03 AM 10/13/2020   11:02 AM 09/07/2020   10:56 AM  Depression screen PHQ 2/9  Decreased Interest 0 0 0 0 0  Down, Depressed, Hopeless 0 0 0 0 0  PHQ - 2 Score 0 0 0 0 0  Altered sleeping 0 0 2 0 1  Tired, decreased energy 0 0 1 0 0  Change in appetite 0 0 0 0 0  Feeling bad or failure about yourself  0 0 0 0 0  Trouble concentrating 0 0 0 0 0  Moving slowly or fidgety/restless 0 0 0 0 0  Suicidal thoughts 0 0 0 0 0  PHQ-9 Score 0 0 3 0 1  Difficult doing work/chores  Not difficult at all           06/12/2022   11:25 AM 01/27/2021    9:03 AM 10/13/2020   11:02 AM 09/07/2020   10:57 AM  GAD 7 : Generalized Anxiety Score  Nervous, Anxious, on Edge 0 0 0 0  Control/stop worrying 0 0 0 0  Worry too much - different things 0 0 0 0  Trouble relaxing 0 0 0 0  Restless 0 0 0 0  Easily annoyed or irritable 0 0 0 0  Afraid - awful might happen 0 0 0 0  Total GAD 7 Score 0 0 0 0     Review of Systems:   Pertinent items  are noted in HPI Denies any headaches, blurred vision, fatigue, shortness of breath, chest pain, abdominal pain, abnormal vaginal discharge/itching/odor/irritation, problems with periods, bowel movements, urination, or intercourse unless otherwise stated above. Pertinent History Reviewed:  Reviewed past medical,surgical, social and family history.  Reviewed problem list, medications and allergies. Physical Assessment:   Vitals:   06/12/22 1124  BP: 127/81  Pulse: 71  Weight: 136 lb (61.7 kg)  Height: 5\' 6"  (1.676 m)  Body mass index is 21.95 kg/m.        Physical Examination:   General appearance - well appearing, and in no distress  Mental status - alert, oriented to person, place, and time  Psych:  She has a normal mood and affect  Skin - warm and dry, normal color, no suspicious lesions noted  Chest - effort normal, all lung fields clear to auscultation bilaterally  Heart - normal rate and regular rhythm  Neck:  midline trachea, no thyromegaly or nodules  Breasts - breasts appear normal, no suspicious masses, no skin or nipple  changes or  axillary nodes  Abdomen - soft, nontender, nondistended, no masses or organomegaly  Pelvic - VULVA: normal appearing vulva with no masses, tenderness or lesions  VAGINA: normal appearing vagina with normal color and discharge, no lesions  CERVIX: normal appearing cervix without discharge or lesions, no CMT  Thin prep pap is not done   UTERUS: uterus is felt to be normal size, shape, consistency and nontender   ADNEXA: No adnexal masses or tenderness noted.  Extremities:  No swelling or varicosities noted  Chaperone: Latisha Cresenzo    No results found for this or any previous visit (from the past 24 hour(s)).  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   Meds:  Meds ordered this encounter  Medications   medroxyPROGESTERone (DEPO-PROVERA) 150 MG/ML injection    Sig: Inject 1 mL (150 mg total) into the muscle every 3 (three) months.    Dispense:  1 mL    Refill:  4    Order Specific Question:   Supervising Provider    Answer:   Lazaro Arms [[id number]]    Follow-up: Return for this week for nurse depo visit; then 24yr for physical.  Cheral Marker CNM, WHNP-BC 06/12/2022 11:45 AM

## 2022-06-13 ENCOUNTER — Ambulatory Visit (HOSPITAL_COMMUNITY): Payer: Medicaid Other

## 2022-06-14 LAB — CERVICOVAGINAL ANCILLARY ONLY
Chlamydia: NEGATIVE
Comment: NEGATIVE
Comment: NORMAL
Neisseria Gonorrhea: NEGATIVE

## 2022-06-15 ENCOUNTER — Ambulatory Visit (INDEPENDENT_AMBULATORY_CARE_PROVIDER_SITE_OTHER): Payer: Medicaid Other | Admitting: *Deleted

## 2022-06-15 DIAGNOSIS — Z3042 Encounter for surveillance of injectable contraceptive: Secondary | ICD-10-CM

## 2022-06-15 MED ORDER — MEDROXYPROGESTERONE ACETATE 150 MG/ML IM SUSP
150.0000 mg | Freq: Once | INTRAMUSCULAR | Status: AC
  Administered 2022-06-15: 150 mg via INTRAMUSCULAR

## 2022-06-15 NOTE — Progress Notes (Signed)
   NURSE VISIT- INJECTION  SUBJECTIVE:  Elizabeth Larsen is a 21 y.o. G71P1001 female here for a Depo Provera for contraception/period management. She is a GYN patient.   OBJECTIVE:  There were no vitals taken for this visit.  Appears well, in no apparent distress  Injection administered in: Left deltoid  Meds ordered this encounter  Medications   medroxyPROGESTERone (DEPO-PROVERA) injection 150 mg    ASSESSMENT: GYN patient Depo Provera for contraception/period management PLAN: Follow-up: in 11-13 weeks for next Depo   Malachy Mood  06/15/2022 12:15 PM

## 2022-06-20 ENCOUNTER — Ambulatory Visit (HOSPITAL_COMMUNITY)
Admission: RE | Admit: 2022-06-20 | Discharge: 2022-06-20 | Disposition: A | Discharge status: Home / Self Care | Payer: Medicaid Other | Source: Ambulatory Visit | Attending: Women's Health | Admitting: Women's Health

## 2022-06-20 ENCOUNTER — Encounter (HOSPITAL_COMMUNITY): Payer: Self-pay

## 2022-06-20 DIAGNOSIS — N644 Mastodynia: Secondary | ICD-10-CM

## 2022-07-10 ENCOUNTER — Ambulatory Visit
Admission: EM | Admit: 2022-07-10 | Discharge: 2022-07-10 | Disposition: A | Discharge status: Home / Self Care | Payer: Medicaid Other | Attending: Nurse Practitioner | Admitting: Nurse Practitioner

## 2022-07-10 DIAGNOSIS — J029 Acute pharyngitis, unspecified: Secondary | ICD-10-CM | POA: Diagnosis not present

## 2022-07-10 DIAGNOSIS — J069 Acute upper respiratory infection, unspecified: Secondary | ICD-10-CM | POA: Insufficient documentation

## 2022-07-10 DIAGNOSIS — Z20822 Contact with and (suspected) exposure to covid-19: Secondary | ICD-10-CM | POA: Diagnosis not present

## 2022-07-10 DIAGNOSIS — Z79899 Other long term (current) drug therapy: Secondary | ICD-10-CM | POA: Insufficient documentation

## 2022-07-10 DIAGNOSIS — R059 Cough, unspecified: Secondary | ICD-10-CM | POA: Insufficient documentation

## 2022-07-10 LAB — RESP PANEL BY RT-PCR (RSV, FLU A&B, COVID)  RVPGX2
Influenza A by PCR: NEGATIVE
Influenza B by PCR: NEGATIVE
Resp Syncytial Virus by PCR: NEGATIVE
SARS Coronavirus 2 by RT PCR: NEGATIVE

## 2022-07-10 MED ORDER — BENZONATATE 100 MG PO CAPS
100.0000 mg | ORAL_CAPSULE | Freq: Three times a day (TID) | ORAL | 0 refills | Status: DC | PRN
Start: 2022-07-10 — End: 2022-09-06

## 2022-07-10 MED ORDER — PROMETHAZINE-DM 6.25-15 MG/5ML PO SYRP: 5.0000 mL | ORAL_SOLUTION | Freq: Every evening | ORAL | 0 refills | Status: DC | PRN

## 2022-07-10 NOTE — ED Provider Notes (Signed)
[facility]    CSN: [id number] Arrival date & time: 07/10/22  1033      History   Chief Complaint Chief Complaint  Patient presents with   Sore Throat   Cough    HPI Elizabeth Larsen is a 21 y.o. female.   Patient presents with a sore throat and dry cough for the past 4 days.  She denies fever, body aches, and congestion.  She does have post nasal drainage, decreased appetite, and fatigue.  She denies shortness of breath, wheezing, chest pain/tightness, sneezing, headache, tooth pain, ear pain or drainage, abdominal pain, nausea/vomiting, and diarrhea.  She is eating and drinking well. Has been taking cough syrup for symptoms without much relief.  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 Authorizing Provider  benzonatate (TESSALON) 100 MG capsule Take 1 capsule (100 mg total) by mouth 3 (three) times daily as needed for cough. Do not take with alcohol or while driving or operating heavy machinery 07/10/22  Yes Valentino Nose, NP  promethazine-dextromethorphan (PROMETHAZINE-DM) 6.25-15 MG/5ML syrup Take 5 mLs by mouth at bedtime as needed for cough. Do not  take with alcohol or while driving or operating heavy machinery 07/10/22  Yes Valentino Nose, NP  medroxyPROGESTERone (DEPO-PROVERA) 150 MG/ML injection Inject 1 mL (150 mg total) into the muscle every 3 (three) months. 06/12/22   Cheral Marker, CNM    Family History Family History  Problem Relation Age of Onset   Heart attack Father    Polycystic kidney disease Sister     Social History Social History   Tobacco Use   Smoking status: Former    Types: E-cigarettes   Smokeless tobacco: Never   Tobacco comments:    Vapes   Vaping Use   Vaping Use: Former   Substances: Nicotine, Flavoring  Substance Use Topics   Alcohol use: Not Currently    Comment: Occasional   Drug use: Never     Allergies   Shellfish allergy and Shrimp (diagnostic)   Review of Systems Review of Systems Per HPI  Physical Exam Triage Vital Signs ED Triage Vitals [07/10/22 1047]  Enc Vitals Group     BP 122/80     Pulse Rate 76     Resp 18     Temp 99.3 F (37.4 C)     Temp Source Oral     SpO2  98 %     Weight      Height      Head Circumference      Peak Flow      Pain Score 0     Pain Loc      Pain Edu?      Excl. in GC?    No data found.  Updated Vital Signs BP 122/80 (BP Location: Right Arm)   Pulse 76   Temp 99.3 F (37.4 C) (Oral)   Resp 18   SpO2 98%   Visual Acuity Right Eye Distance:   Left Eye Distance:   Bilateral Distance:    Right Eye Near:   Left Eye Near:    Bilateral Near:     Physical Exam Vitals and nursing note reviewed.  Constitutional:      General: She is not in acute distress.    Appearance: Normal appearance. She is not ill-appearing or toxic-appearing.  HENT:     Head: Normocephalic and atraumatic.     Right Ear: Tympanic membrane, ear canal and external ear normal. No drainage, swelling or tenderness. No middle ear effusion. Tympanic membrane is not erythematous.     Left Ear: Tympanic membrane, ear canal and external ear normal. No  drainage, swelling or tenderness.  No middle ear effusion. Tympanic membrane is not erythematous.     Nose: Rhinorrhea present. No congestion.     Mouth/Throat:     Mouth: Mucous membranes are moist.     Pharynx: Oropharynx is clear. Posterior oropharyngeal erythema present. No oropharyngeal exudate.     Tonsils: No tonsillar exudate. 0 on the right. 0 on the left.     Comments: Cobblestoning of posterior pharynx Eyes:     General: No scleral icterus.    Extraocular Movements: Extraocular movements intact.  Cardiovascular:     Rate and Rhythm: Normal rate and regular rhythm.  Pulmonary:     Effort: Pulmonary effort is normal. No respiratory distress.     Breath sounds: Normal breath sounds. No wheezing, rhonchi or rales.  Abdominal:     General: Abdomen is flat. Bowel sounds are normal. There is no distension.     Palpations: Abdomen is soft.  Musculoskeletal:     Cervical back: Normal range of motion and neck supple.  Lymphadenopathy:     Cervical: No cervical adenopathy.  Skin:    General: Skin is warm and dry.     Coloration: Skin is not jaundiced or pale.     Findings: No erythema or rash.  Neurological:     Mental Status: She is alert and oriented to person, place, and time.  Psychiatric:        Behavior: Behavior is cooperative.      UC Treatments / Results  Labs (all labs ordered are listed, but only abnormal results are displayed) Labs Reviewed  RESP PANEL BY RT-PCR (RSV, FLU A&B, COVID)  [id number]    EKG   Radiology No results found.  Procedures Procedures (including critical care time)  Medications Ordered in UC Medications - No data to display  Initial Impression / Assessment and Plan / UC Course  I have reviewed the triage vital signs and the nursing notes.  Pertinent labs & imaging results that were available during my care of the patient were reviewed by me and considered in my medical decision making (see chart for details).    Patient is a very  pleasant, well-appearing 21 year old female presenting for acute pharyngitis and viral upper respiratory infection  symptoms.  In triage, she is normotensive, not tachycardic, oxygenating well on room air.  She has a low-grade fever.  COVID-19 testing, influenza, and RSV obtained given exposures to small children in daycare.  Reassurance provided that symptoms are likely viral in etiology and should improve over the next 7 to 10 days.  Supportive care discussed.  Start cough suppressants as needed.  Seek care if symptoms persist more than 10 days without improvement.  Note given for work.  The patient was given the opportunity to ask questions.  All questions answered to their satisfaction.  The patient is in agreement to this plan.  Final Clinical Impressions(s) / UC Diagnoses   Final diagnoses:  Acute pharyngitis, unspecified etiology  Viral URI with cough     Discharge Instructions      Your symptoms and exam findings are most consistent with a viral upper respiratory infection. These usually run their course in about 10 days.  If your symptoms last longer than 10 days without improvement, please follow up with your primary care provider.  If your symptoms, worsen, please go to the Emergency Room.    We have tested you today for RSV, COVID-19, and influenza.  You will see the results in Mychart and we will call you with positive results.    Please stay home and isolate until you are aware of the results.    Some things that can make you feel better are: - Increased rest - Increasing fluid with water/sugar free electrolytes - Acetaminophen and ibuprofen as needed for fever/pain.  - Salt water gargling, chloraseptic spray and throat lozenges - OTC guaifenesin (Mucinex).  - Saline sinus flushes or a neti pot.  - Humidifying the air. - Tessalon perles during the day as long as they do not make you sleepy. - Cough syrup at night time for cough.      ED Prescriptions     Medication Sig  Dispense Auth. Provider   benzonatate (TESSALON) 100 MG capsule Take 1 capsule (100 mg total) by mouth 3 (three) times daily as needed for cough. Do not take with alcohol or while driving or operating heavy machinery 21 capsule Cathlean Marseilles A, NP   promethazine-dextromethorphan (PROMETHAZINE-DM) 6.25-15 MG/5ML syrup Take 5 mLs by mouth at bedtime as needed for cough. Do not take with alcohol or while driving or operating heavy machinery 118 mL Valentino Nose, NP      PDMP not reviewed this encounter.   Valentino Nose, NP 07/10/22 1246

## 2022-07-10 NOTE — Discharge Instructions (Signed)
Your symptoms and exam findings are most consistent with a viral upper respiratory infection. These usually run their course in about 10 days.  If your symptoms last longer than 10 days without improvement, please follow up with your primary care provider.  If your symptoms, worsen, please go to the Emergency Room.    We have tested you today for RSV, COVID-19, and influenza.  You will see the results in Mychart and we will call you with positive results.    Please stay home and isolate until you are aware of the results.    Some things that can make you feel better are: - Increased rest - Increasing fluid with water/sugar free electrolytes - Acetaminophen and ibuprofen as needed for fever/pain.  - Salt water gargling, chloraseptic spray and throat lozenges - OTC guaifenesin (Mucinex).  - Saline sinus flushes or a neti pot.  - Humidifying the air. - Tessalon perles during the day as long as they do not make you sleepy. - Cough syrup at night time for cough.

## 2022-07-10 NOTE — ED Triage Notes (Signed)
Pt presents with c/o a sore throat and cough X 4 days.  Pt states she has tried cough medicine and cough drops c/o no relief. States she took a covid test and states it was negative.

## 2022-07-25 ENCOUNTER — Ambulatory Visit
Admission: RE | Admit: 2022-07-25 | Discharge: 2022-07-25 | Disposition: A | Discharge status: Home / Self Care | Payer: Medicaid Other | Source: Ambulatory Visit | Attending: Family Medicine | Admitting: Family Medicine

## 2022-07-25 VITALS — BP 122/86 | HR 76 | Temp 98.2°F | Resp 16

## 2022-07-25 DIAGNOSIS — J069 Acute upper respiratory infection, unspecified: Secondary | ICD-10-CM

## 2022-07-25 DIAGNOSIS — R062 Wheezing: Secondary | ICD-10-CM | POA: Diagnosis not present

## 2022-07-25 MED ORDER — PREDNISOLONE 15 MG/5ML PO SOLN: 60.0000 mg | Freq: Every day | ORAL | 0 refills | Status: AC

## 2022-07-25 MED ORDER — ALBUTEROL SULFATE HFA 108 (90 BASE) MCG/ACT IN AERS: 1.0000 | INHALATION_SPRAY | Freq: Four times a day (QID) | RESPIRATORY_TRACT | 0 refills | Status: DC | PRN

## 2022-07-25 MED ORDER — AZITHROMYCIN 200 MG/5ML PO SUSR
ORAL | 0 refills | Status: DC
Start: 2022-07-25 — End: 2022-09-06

## 2022-07-25 NOTE — ED Provider Notes (Signed)
[facility]    CSN: [id number] Arrival date & time: 07/25/22  1019      History   Chief Complaint Chief Complaint  Patient presents with   Nasal Congestion    Came on august 21st 2023 and still having pain on side, cough, congestion - Entered by patient    HPI KAITLYND PHILLIPS is a 21 y.o. female.   Presenting today with 3-week history of progressively worsening cough, nasal congestion, chest tightness, wheezing.  Denies fever, chills, chest pain, shortness of breath, abdominal pain, nausea vomiting or diarrhea.  Was seen here on August 21 and given cough medications that have not been helping.  Denies any known history of seasonal allergies, asthma.  Trying over-the-counter cough medicines with no relief additionally.    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 Authorizing Provider  albuterol (VENTOLIN HFA) 108 (90 Base) MCG/ACT inhaler Inhale 1-2 puffs into the lungs every 6 (six) hours as needed for wheezing or shortness of breath. 07/25/22  Yes Particia Nearing, PA-C  azithromycin [facility]) 200 MG/5ML suspension Take 12.6 mL day one, then 6.3 mL daily for 4 days 07/25/22  Yes Particia Nearing, PA-C   prednisoLONE (PRELONE) 15 MG/5ML SOLN Take 20 mLs (60 mg total) by mouth daily before breakfast for 5 days. 07/25/22 07/30/22 Yes Particia Nearing, PA-C  benzonatate (TESSALON) 100 MG capsule Take 1 capsule (100 mg total) by mouth 3 (three) times daily as needed for cough. Do not take with alcohol or while driving or operating heavy machinery 07/10/22   Valentino Nose, NP  medroxyPROGESTERone (DEPO-PROVERA) 150 MG/ML injection Inject 1 mL (150 mg total) into the muscle every 3 (three) months. 06/12/22   Cheral Marker, CNM  promethazine-dextromethorphan (PROMETHAZINE-DM) 6.25-15 MG/5ML syrup Take 5 mLs by mouth at bedtime as needed for cough. Do not take with alcohol or while driving or operating heavy machinery 07/10/22   Valentino Nose, NP    Family History Family History  Problem Relation Age of Onset   Heart attack Father    Polycystic kidney disease Sister     Social History Social History   Tobacco Use   Smoking status: Former    Types: E-cigarettes   Smokeless tobacco: Never   Tobacco comments:    Conservation officer, nature Use: Former   Substances: Nicotine, Flavoring  Substance Use Topics   Alcohol use: Not Currently    Comment: Occasional  Drug use: Never     Allergies   Shellfish allergy and Shrimp (diagnostic)   Review of Systems Review of Systems Per HPI  Physical Exam Triage Vital Signs ED Triage Vitals  Enc Vitals Group     BP 07/25/22 1104 122/86     Pulse Rate 07/25/22 1104 76     Resp 07/25/22 1104 16     Temp 07/25/22 1104 98.2 F (36.8 C)     Temp Source 07/25/22 1104 Oral     SpO2 07/25/22 1104 98 %     Weight --      Height --      Head Circumference --      Peak Flow --      Pain Score 07/25/22 1105 0     Pain Loc --      Pain Edu? --      Excl. in GC? --    No data found.  Updated Vital Signs BP 122/86 (BP Location: Right Arm)   Pulse 76   Temp 98.2 F (36.8 C) (Oral)   Resp 16   SpO2 98%   Visual  Acuity Right Eye Distance:   Left Eye Distance:   Bilateral Distance:    Right Eye Near:   Left Eye Near:    Bilateral Near:     Physical Exam Vitals and nursing note reviewed.  Constitutional:      Appearance: Normal appearance.  HENT:     Head: Atraumatic.     Right Ear: Tympanic membrane and external ear normal.     Left Ear: Tympanic membrane and external ear normal.     Nose: Congestion present.     Mouth/Throat:     Mouth: Mucous membranes are moist.     Pharynx: Posterior oropharyngeal erythema present.  Eyes:     Extraocular Movements: Extraocular movements intact.     Conjunctiva/sclera: Conjunctivae normal.  Cardiovascular:     Rate and Rhythm: Normal rate and regular rhythm.     Heart sounds: Normal heart sounds.  Pulmonary:     Effort: Pulmonary effort is normal. No respiratory distress.     Breath sounds: Wheezing present. No rales.     Comments: Mild wheezes bilaterally Musculoskeletal:        General: Normal range of motion.     Cervical back: Normal range of motion and neck supple.  Skin:    General: Skin is warm and dry.  Neurological:     Mental Status: She is alert and oriented to person, place, and time.  Psychiatric:        Mood and Affect: Mood normal.        Thought Content: Thought content normal.      UC Treatments / Results  Labs (all labs ordered are listed, but only abnormal results are displayed) Labs Reviewed - No data to display  EKG   Radiology No results found.  Procedures Procedures (including critical care time)  Medications Ordered in UC Medications - No data to display  Initial Impression / Assessment and Plan / UC Course  I have reviewed the triage vital signs and the nursing notes.  Pertinent labs & imaging results that were available during my care of the patient were reviewed by me and considered in my medical decision making (see chart for details).     Vital signs benign and reassuring today including  oxygen saturation of 98% on room air.  Given duration and worsening course, treat with azithromycin, and for wheezing/bronchitis type  symptoms, will treat with albuterol, prednisolone.  She requests liquid medications today.  Work note given.  Return for worsening symptoms.  Final Clinical Impressions(s) / UC Diagnoses   Final diagnoses:  Upper respiratory tract infection, unspecified type  Wheezing   Discharge Instructions   None    ED Prescriptions     Medication Sig Dispense Auth. Provider   albuterol (VENTOLIN HFA) 108 (90 Base) MCG/ACT inhaler Inhale 1-2 puffs into the lungs every 6 (six) hours as needed for wheezing or shortness of breath. 18 g Particia Nearing, [place]   prednisoLONE (PRELONE) 15 MG/5ML SOLN Take 20 mLs (60 mg total) by mouth daily before breakfast for 5 days. 100 mL Particia Nearing, PA-C   azithromycin [facility]) 200 MG/5ML suspension Take 12.6 mL day one, then 6.3 mL daily for 4 days 37.8 mL Particia Nearing, PA-C      PDMP not reviewed this encounter.   Particia Nearing, [place] 07/25/22 1144

## 2022-07-25 NOTE — ED Triage Notes (Signed)
Cough, nasal congestion x 3 weeks.  States was seen here and given cough meds, but they are not working.

## 2022-09-04 NOTE — Progress Notes (Deleted)
CC: Vomiting  HPI:  [name] is a 21 y.o. 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    Allergies as of 09/05/2022       Reactions   Shellfish Allergy Other (See Comments)   Tongue started burning and bumps developed on tongue.   Shrimp (diagnostic) Other (See Comments)   Tongue started burning and bumps developed on tongue.        Medication List        Accurate as of September 04, 2022 10:59 PM. If you have any questions, ask your nurse or doctor.          albuterol 108 (90 Base) MCG/ACT inhaler Commonly known as: VENTOLIN HFA Inhale 1-2 puffs into the lungs every 6 (six) hours as needed for wheezing or shortness of breath.   azithromycin 200 MG/5ML suspension Commonly known as: ZITHROMAX Take 12.6 mL day one, then 6.3 mL daily for 4 days   benzonatate 100 MG capsule Commonly known as: TESSALON Take 1 capsule (100 mg total) by mouth 3 (three) times daily as needed for cough. Do not take with alcohol or while driving or operating heavy machinery   medroxyPROGESTERone 150 MG/ML injection Commonly known as: DEPO-PROVERA Inject 1 mL (150 mg total) into the muscle every 3 (three) months.   promethazine-dextromethorphan 6.25-15 MG/5ML syrup Commonly known as: PROMETHAZINE-DM Take 5 mLs by mouth at bedtime as needed for cough. Do not take with alcohol or while driving or operating heavy machinery         Past Medical History:  Diagnosis Date   Chlamydia 08/03/2020   Treated 08/03/20 PCO________   History of enlarged tonsils    Hypoglycemia    Review of Systems:  per HPI.   Physical Exam: *** There were no vitals filed for this visit.  *** Constitutional: Well-developed, well-nourished, appears comfortable  HENT: Normocephalic and atraumatic.  Eyes: EOM are normal. PERRL.  Neck: Normal range of motion.  Cardiovascular: Regular rate, regular rhythm. No  murmurs, rubs, or gallops. Normal radial and PT pulses bilaterally. No LE edema.  Pulmonary: Normal respiratory effort. No wheezes, rales, or rhonchi.   Abdominal: Soft. Non-distended. No tenderness. Normal bowel sounds.  Musculoskeletal: Normal range of motion.     Neurological: Alert and oriented to person, place, and time. Non-focal. Skin: warm and dry.    Assessment & Plan:   ?: *** fever, chills, nausea, constipation, diarrhea, changes to color of stool, dizziness, lightheadedness, dizziness, HA, syncope, frequency, dysuria, jaundice, pruritis  Vomiting -  - Patient denies taking any medications at home for her symptoms.    Patient has a history of post-prandial hypoglycemia. Patient typically experiences episodes of jitteriness, shakiness and dizziness after eating. Symptoms are alleviated by glucose tablets. She denies LOC, seizure activity, focal weakness, palpitations, chest pain, or SOB. Patient was seen in clinic in 2021 where post-prandial hypoglycemia was confirmed. Labs were positive for elevated post-prandial C-peptide at 6.2 however insulin levels were normal pre and post prandially. Pro-insulin levels were consistently WNL. Negative for sulfonylureas. Negative for insulin antibodies. Differential initially included insulinoma versus non-insulinoma pancreatogenous hypoglycemia syndrome. MRI was obtained and negative for any intra-abdominal abnormalities. Patient was referred to endocrinology.     Plan: -   2. - Current medications include  Plan: -  3. - Current medications include  Plan: -  4. Health Screening: - Influenza vaccine (), TDAP (), HPV vaccine () - Medication refill?  See Encounters Tab for problem based charting.  Patient seen with Dr. Barbaraann Boys

## 2022-09-06 ENCOUNTER — Ambulatory Visit (INDEPENDENT_AMBULATORY_CARE_PROVIDER_SITE_OTHER): Payer: Medicaid Other | Admitting: *Deleted

## 2022-09-06 DIAGNOSIS — Z3042 Encounter for surveillance of injectable contraceptive: Secondary | ICD-10-CM | POA: Diagnosis not present

## 2022-09-06 DIAGNOSIS — Z3202 Encounter for pregnancy test, result negative: Secondary | ICD-10-CM | POA: Diagnosis not present

## 2022-09-06 LAB — POCT URINE PREGNANCY: Preg Test, Ur: NEGATIVE

## 2022-09-06 MED ORDER — MEDROXYPROGESTERONE ACETATE 150 MG/ML IM SUSP
150.0000 mg | Freq: Once | INTRAMUSCULAR | Status: AC
  Administered 2022-09-06: 150 mg via INTRAMUSCULAR

## 2022-09-06 NOTE — Progress Notes (Signed)
   NURSE VISIT- INJECTION  SUBJECTIVE:  Elizabeth Larsen is a 21 y.o. G44P1001 female here for a Depo Provera for contraception/period management. She is a GYN patient. Pt requested pregnancy test due to stomach pain. UPT negative. Sees PCP on Monday.  OBJECTIVE:  There were no vitals taken for this visit.  Appears well, in no apparent distress  Injection administered in: Right deltoid  Meds ordered this encounter  Medications   medroxyPROGESTERone (DEPO-PROVERA) injection 150 mg    ASSESSMENT: GYN patient Depo Provera for contraception/period management PLAN: Follow-up: in 11-13 weeks for next Depo   Levy Pupa  09/06/2022 4:32 PM

## 2022-09-11 ENCOUNTER — Ambulatory Visit: Payer: Medicaid Other | Admitting: Student

## 2022-09-11 ENCOUNTER — Other Ambulatory Visit: Payer: Self-pay

## 2022-09-11 VITALS — BP 116/75 | HR 79 | Temp 98.0°F | Ht 66.0 in | Wt 137.3 lb

## 2022-09-11 DIAGNOSIS — R197 Diarrhea, unspecified: Secondary | ICD-10-CM | POA: Diagnosis not present

## 2022-09-11 DIAGNOSIS — R1011 Right upper quadrant pain: Secondary | ICD-10-CM | POA: Diagnosis not present

## 2022-09-11 MED ORDER — DICYCLOMINE HCL 10 MG PO CAPS: 10.0000 mg | ORAL_CAPSULE | Freq: Three times a day (TID) | ORAL | 0 refills | Status: DC

## 2022-09-11 NOTE — Assessment & Plan Note (Addendum)
Patient presents with 2 to 3-week history of abdominal pain.  She describes this as a cramping pain that occurs after she starts eating.  She also reports that this is random.  Today, she states that the pain is better but comes and goes.  She also reports having slimy stools.  Patient also reports having more urgency.  Patient denies having any gluten allergy or lactose intolerance.  She reports that she has not tried any medications for this.  She initially thought this was a GI bug, that would go away, but this never went away.  She denies any chance of being pregnant.  Most recent pregnancy test was a week ago which was negative.  Patient does endorse that her maternal grandmother and her mother both needed cholecystectomies.  Current etiology differential includes celiac disease, hyperthyroidism, biliary colic, infectious.  We will obtain labs.  If all negative, could be IBS.  Plan: -Obtain total IgA, CBC, TTG, iron panel, and TSH -Prescribed Bentyl 10 mg with meals -If work-up negative, consider right upper quadrant ultrasound  Addendum: -Labs so far negative for any work-up.  TSH within normal limits, iron panel within normal limits, CBC within normal limits.  IgA within normal limits.  TTG still pending.  CMP within normal limits.  Glucose slightly elevated and bicarb slightly decreased.  No concerns acutely.  Will wait for TTG to further work-up.  Addendum: -TTG negative for celiac disease.  We will proceed to right upper quadrant ultrasound

## 2022-09-11 NOTE — Patient Instructions (Addendum)
Elizabeth Larsen,Thank you for allowing me to take part in your care today.  Here are your instructions.  1.  Regarding your abdominal pain, I have written for some Bentyl.  Please take this with meals.  I have also taken labs to test for celiac disease, to test for any gallbladder causes, and to check your blood counts.  I will call you with the results.  2.  Please follow-up in 1 month for your abdominal pain.  Thank you, Dr. Posey Pronto  If you have any other questions please contact the internal medicine clinic at [phone]

## 2022-09-11 NOTE — Progress Notes (Addendum)
   CC: Abdominal pain  HPI:  [name] is a 21 y.o. female with a past medical history of postprandial hyperglycemia who presented with concerns of 2-week history of abdominal pain.  Please see assessment and plan for further HPI.  Past Medical History:  Diagnosis Date   Chlamydia 08/03/2020   Treated 08/03/20 PCO________   History of enlarged tonsils    Hypoglycemia      Current Outpatient Medications:    dicyclomine (BENTYL) 10 MG capsule, Take 1 capsule (10 mg total) by mouth 3 (three) times daily before meals., Disp: 60 capsule, Rfl: 0   medroxyPROGESTERone (DEPO-PROVERA) 150 MG/ML injection, Inject 1 mL (150 mg total) into the muscle every 3 (three) months., Disp: 1 mL, Rfl: 4  Review of Systems:   GI: Patient endorses abdominal pain, urgency, and bloating  Physical Exam:  Vitals:   09/11/22 1140  BP: 116/75  Pulse: 79  Temp: 98 F (36.7 C)  TempSrc: Oral  SpO2: 97%  Weight: 137 lb 4.8 oz (62.3 kg)  Height: 5\' 6"  (1.676 m)    General: Patient is sitting comfortably in the room  Abdomen: Soft, with diffuse tenderness noted.  No rebound or guarding.  Normal active bowel sounds.  No rashes noted.   Assessment & Plan:   Right upper quadrant abdominal pain Patient presents with 2 to 3-week history of abdominal pain.  She describes this as a cramping pain that occurs after she starts eating.  She also reports that this is random.  Today, she states that the pain is better but comes and goes.  She also reports having slimy stools.  Patient also reports having more urgency.  Patient denies having any gluten allergy or lactose intolerance.  She reports that she has not tried any medications for this.  She initially thought this was a GI bug, that would go away, but this never went away.  She denies any chance of being pregnant.  Most recent pregnancy test was a week ago which was negative.  Patient does endorse that her maternal grandmother and her mother both  needed cholecystectomies.  Current etiology differential includes celiac disease, hyperthyroidism, biliary colic, infectious.  We will obtain labs.  If all negative, could be IBS.  Plan: -Obtain total IgA, CBC, TTG, iron panel, and TSH -Prescribed Bentyl 10 mg with meals -If work-up negative, consider right upper quadrant ultrasound  Addendum: -Labs so far negative for any work-up.  TSH within normal limits, iron panel within normal limits, CBC within normal limits.  IgA within normal limits.  TTG still pending.  CMP within normal limits.  Glucose slightly elevated and bicarb slightly decreased.  No concerns acutely.  Will wait for TTG to further work-up.  Addendum: -TTG negative for celiac disease.  We will proceed to right upper quadrant ultrasound  Patient seen with Dr. Wray Kearns, DO PGY-1 Internal Medicine Resident  Pager: [phone]

## 2022-09-11 NOTE — Addendum Note (Signed)
Addended by: Leigh Aurora on: 09/11/2022 06:21 PM   Modules accepted: Level of Service

## 2022-09-12 ENCOUNTER — Encounter: Payer: Self-pay | Admitting: Student

## 2022-09-12 ENCOUNTER — Telehealth: Payer: Self-pay

## 2022-09-12 LAB — CMP14 + ANION GAP
ALT: 12 IU/L (ref 0–32)
AST: 14 IU/L (ref 0–40)
Albumin/Globulin Ratio: 2.3 — ABNORMAL HIGH (ref 1.2–2.2)
Albumin: 4.9 g/dL (ref 4.0–5.0)
Alkaline Phosphatase: 85 IU/L (ref 44–121)
Anion Gap: 18 mmol/L (ref 10.0–18.0)
BUN/Creatinine Ratio: 9 (ref 9–23)
BUN: 7 mg/dL (ref 6–20)
Bilirubin Total: 0.4 mg/dL (ref 0.0–1.2)
CO2: 18 mmol/L — ABNORMAL LOW (ref 20–29)
Calcium: 9.8 mg/dL (ref 8.7–10.2)
Chloride: 105 mmol/L (ref 96–106)
Creatinine, Ser: 0.78 mg/dL (ref 0.57–1.00)
Globulin, Total: 2.1 g/dL (ref 1.5–4.5)
Glucose: 122 mg/dL — ABNORMAL HIGH (ref 70–99)
Potassium: 3.8 mmol/L (ref 3.5–5.2)
Sodium: 141 mmol/L (ref 134–144)
Total Protein: 7 g/dL (ref 6.0–8.5)
eGFR: 109 mL/min/{1.73_m2} (ref >59)

## 2022-09-12 LAB — IRON,TIBC AND FERRITIN PANEL
Ferritin: 36 ng/mL (ref 15–150)
Iron Saturation: 24 % (ref 15–55)
Iron: 72 ug/dL (ref 27–159)
Total Iron Binding Capacity: 296 ug/dL (ref 250–450)
UIBC: 224 ug/dL (ref 131–425)

## 2022-09-12 LAB — CBC
Hematocrit: 43.2 % (ref 34.0–46.6)
Hemoglobin: 14.5 g/dL (ref 11.1–15.9)
MCH: 30.1 pg (ref 26.6–33.0)
MCHC: 33.6 g/dL (ref 31.5–35.7)
MCV: 90 fL (ref 79–97)
Platelets: 296 10*3/uL (ref 150–450)
RBC: 4.82 x10E6/uL (ref 3.77–5.28)
RDW: 12.8 % (ref 11.7–15.4)
WBC: 5.8 10*3/uL (ref 3.4–10.8)

## 2022-09-12 LAB — TSH: TSH: 1.71 u[IU]/mL (ref 0.450–4.500)

## 2022-09-12 LAB — IGA: IgA/Immunoglobulin A, Serum: 135 mg/dL (ref 87–352)

## 2022-09-12 LAB — TISSUE TRANSGLUTAMINASE ABS,IGG,IGA
Tissue Transglut Ab: <2 U/mL (ref 0–5)
Transglutaminase IgA: <2 U/mL (ref 0–3)

## 2022-09-12 NOTE — Progress Notes (Signed)
Internal Medicine Clinic Attending  I saw and evaluated the patient.  I personally confirmed the key portions of the history and exam documented by Dr. Posey Pronto and I reviewed pertinent patient test results.  The assessment, diagnosis, and plan were formulated together and I agree with the documentation in the resident's note.   If serologic work up for abdominal pain is unrevealing, recommend RUQ ultrasound to rule out biliary colic.

## 2022-09-12 NOTE — Telephone Encounter (Signed)
Questions about lab results, please call pt back.  

## 2022-09-12 NOTE — Addendum Note (Signed)
Addended by: Jodean Lima on: 09/12/2022 03:08 PM   Modules accepted: Level of Service

## 2022-09-12 NOTE — Addendum Note (Signed)
Addended by: Leigh Aurora on: 09/12/2022 06:07 PM   Modules accepted: Orders

## 2022-09-15 ENCOUNTER — Encounter: Payer: Self-pay | Admitting: Student

## 2022-09-25 ENCOUNTER — Ambulatory Visit (HOSPITAL_COMMUNITY)
Admission: RE | Admit: 2022-09-25 | Discharge: 2022-09-25 | Disposition: A | Discharge status: Home / Self Care | Payer: Medicaid Other | Source: Ambulatory Visit | Attending: Internal Medicine | Admitting: Internal Medicine

## 2022-09-25 DIAGNOSIS — R1011 Right upper quadrant pain: Secondary | ICD-10-CM | POA: Diagnosis not present

## 2022-09-25 DIAGNOSIS — R109 Unspecified abdominal pain: Secondary | ICD-10-CM | POA: Diagnosis not present

## 2022-09-28 ENCOUNTER — Encounter: Payer: Self-pay | Admitting: Student

## 2022-10-01 ENCOUNTER — Encounter: Payer: Self-pay | Admitting: Student

## 2022-10-01 NOTE — Telephone Encounter (Signed)
I have called and discussed with patient.

## 2022-10-02 NOTE — Telephone Encounter (Signed)
Call placed to patient. States her previous stool symptoms returned after ~ 2 weeks: Cramping pain with BM, mucous in stool, "slime with passing gas." She is reassured that abd u/s did not reveal any abnormalities. Also, c/o of intermittent low pelvic cramping that lasts 1-2 seconds. States it feels like menstrual cramping but she does not have periods as she is on depo provera injections. She has upcoming appt on 11/22 and feels she can wait till then to be seen. She will head to UC/ED if symptoms worsen.

## 2022-10-11 ENCOUNTER — Ambulatory Visit: Payer: Medicaid Other | Admitting: Internal Medicine

## 2022-10-11 ENCOUNTER — Encounter: Payer: Self-pay | Admitting: Internal Medicine

## 2022-10-11 ENCOUNTER — Other Ambulatory Visit: Payer: Self-pay

## 2022-10-11 VITALS — BP 115/79 | HR 93 | Temp 98.4°F | Resp 28 | Ht 66.0 in | Wt 136.1 lb

## 2022-10-11 DIAGNOSIS — J00 Acute nasopharyngitis [common cold]: Secondary | ICD-10-CM | POA: Insufficient documentation

## 2022-10-11 DIAGNOSIS — J40 Bronchitis, not specified as acute or chronic: Secondary | ICD-10-CM | POA: Insufficient documentation

## 2022-10-11 DIAGNOSIS — K581 Irritable bowel syndrome with constipation: Secondary | ICD-10-CM | POA: Diagnosis not present

## 2022-10-11 MED ORDER — PSYLLIUM 58.6 % PO PACK
1.0000 | PACK | Freq: Every day | ORAL | 12 refills | Status: DC
Start: 2022-10-11 — End: 2022-11-29

## 2022-10-11 MED ORDER — ALBUTEROL SULFATE HFA 108 (90 BASE) MCG/ACT IN AERS: 2.0000 | INHALATION_SPRAY | Freq: Four times a day (QID) | RESPIRATORY_TRACT | 2 refills | Status: DC | PRN

## 2022-10-11 NOTE — Assessment & Plan Note (Signed)
Patient describes symptoms that began Saturday of nasal congestion, runny nose, and sneezing. She denies fevers, chills, or cough. She has tried robitussin for symptom relief at home without success. She has not tried Mucinex but intends to try that next. Plan: Discussed staying well hydrated and trying Mucinex and nasal sprays at home.

## 2022-10-11 NOTE — Patient Instructions (Signed)
Ms. Legault,  It was a pleasure to care for you today!  I have placed a referral to GI for your ongoing stomach problems. I have also sent in psyllium that you can take daily to help increase your fiber intake.  For your nasal congestion and wheezing, you can try taking Mucinex. Make sure to stay hydrated! You can also use an albuterol inhaler if the wheezing persists.  My best, Dr. August Saucer

## 2022-10-11 NOTE — Progress Notes (Signed)
Internal Medicine Clinic Attending  Case discussed with the resident at the time of the visit.  We reviewed the resident's history and exam and pertinent patient test results.  I agree with the assessment, diagnosis, and plan of care documented in the resident's note.  

## 2022-10-11 NOTE — Progress Notes (Deleted)
Did not take the bentyl, stomach pains went away Didn't try miralax yet  1-3 BM of a few small balls of hard stool every day Last night was larger pellets No fiber in diet  Strains Urgency No blood, but mucus Still has pain, sometimes bad, over center stomach  Sinus congestion but no fever chills cough but some sneeze and runny nose.  Took robitussin Hasn't tried mucinex

## 2022-10-11 NOTE — Assessment & Plan Note (Addendum)
Patient notes onset of wheezing today. She has had wheezing in the past after being evaluated for upper respiratory tract infection that was thought to have become bacterial in nature. She took azithromycin, prednisone, and albuterol at that time with improvement of symptoms. She has never been diagnosed with asthma. She does not have eczema.  Assessment: Patient does have R-sided expiratory wheezing. She is not experiencing dyspnea, has no cough, mucous production, or inability to carry out regular ADLs due to this new wheezing. Plan:Albuterol inhaler refilled for use as needed. If concern for multiple recurrent bronchitis episodes in the future and asthma, can consider PFTs.

## 2022-10-11 NOTE — Assessment & Plan Note (Addendum)
Patient describes her symptoms over the last one month as continued intermittent pain with constipation and mucousy stools. At her initial visit for this conern, she had a 2-3 week history of the pain that was cramping and associated with eating, with no pattern, and that it comes and goes. She also had mucous-coated stools. Lab testing for celiac disease was negative, and thyroid studies, iron studies, CMP, and CBC were normal. RUQ ultrasound was negative for cholelithiasis or sonographic evidence for acute cholecystitis. She has not been taking Bentyl because her stomach pain went away temporarily. With her constipation right now this is likely for the best. She says that she has stool urgency and has to strain because of constipation; she has 1-3 bowel movements daily with a few small, hard stool pellets. She denies blood or pain with the bowel movements. She still has mucous that ranges from clear to white to yellow. Some abdominal pain, ranging in minor to severe. Bloating is still a bothersome symptom as well. She does not eat vegetables or a lot of fiber. Assessment: I suspect patient may have IBS. This is a diagnosis of exclusion and I believe she would benefit from specialist evaluation. Plan:Referral placed to GI. Counseled patient on increasing dietary fiber intake and recommended psyllium daily.

## 2022-10-11 NOTE — Progress Notes (Signed)
   CC: abdominal pain follow-up  HPI:  [name] is a 21 y.o. person with past medical history as detailed below who presents today for follow up of abdominal exam. She also complains of symptoms of acute rhinitis and wheezing. Please see problem based charting for detailed assessment and plan.  Past Medical History:  Diagnosis Date   Chlamydia 08/03/2020   Treated 08/03/20 PCO________   History of enlarged tonsils    Hypoglycemia    Review of Systems:  Negative unless otherwise stated.  Physical Exam:  Vitals:   10/11/22 0903  BP: 115/79  Pulse: 93  Resp: (!) 28  Temp: 98.4 F (36.9 C)  TempSrc: Oral  SpO2: 99%  Weight: 136 lb 1.6 oz (61.7 kg)  Height: 5\' 6"  (1.676 m)   Constitutional:Appears stated age, well. In no acute distress. Cardio:Regular rate and rhythm. No murmurs, rubs, or gallops. Pulm:R expiratory wheezing. Normal work of breathing on room air. Abdomen:Nontender. for extremity edema. Skin:Warm and dry. Neuro:Alert and oriented x3. No focal deficit noted. Psych:Pleasant mood and affect.   Assessment & Plan:   See Encounters Tab for problem based charting.  Irritable bowel syndrome with constipation Patient describes her symptoms over the last one month as continued intermittent pain with constipation and mucousy stools. At her initial visit for this conern, she had a 2-3 week history of the pain that was cramping and associated with eating, with no pattern, and that it comes and goes. She also had mucous-coated stools. Lab testing for celiac disease was negative, and thyroid studies, iron studies, CMP, and CBC were normal. RUQ ultrasound was negative for cholelithiasis or sonographic evidence for acute cholecystitis. She has not been taking Bentyl because her stomach pain went away temporarily. With her constipation right now this is likely for the best. She says that she has stool urgency and has to strain because of constipation;  she has 1-3 bowel movements daily with a few small, hard stool pellets. She denies blood or pain with the bowel movements. She still has mucous that ranges from clear to white to yellow. Some abdominal pain, ranging in minor to severe. Bloating is still a bothersome symptom as well. She does not eat vegetables or a lot of fiber. Assessment: I suspect patient may have IBS. This is a diagnosis of exclusion and I believe she would benefit from specialist evaluation. Plan:Referral placed to GI. Counseled patient on increasing dietary fiber intake and recommended psyllium daily.   Acute rhinitis Patient describes symptoms that began Saturday of nasal congestion, runny nose, and sneezing. She denies fevers, chills, or cough. She has tried robitussin for symptom relief at home without success. She has not tried Mucinex but intends to try that next. Plan: Discussed staying well hydrated and trying Mucinex and nasal sprays at home.  Bronchitis Patient notes onset of wheezing today. She has had wheezing in the past after being evaluated for upper respiratory tract infection that was thought to have become bacterial in nature. She took azithromycin, prednisone, and albuterol at that time with improvement of symptoms. She has never been diagnosed with asthma. She does not have eczema.  Assessment: Patient does have R-sided expiratory wheezing. She is not experiencing dyspnea, has no cough, mucous production, or inability to carry out regular ADLs due to this new wheezing. Plan:Albuterol inhaler refilled for use as needed. If concern for multiple recurrent bronchitis episodes in the future and asthma, can consider PFTs.  Patient discussed with Dr. Wednesday

## 2022-10-12 DIAGNOSIS — F410 Panic disorder [episodic paroxysmal anxiety] without agoraphobia: Secondary | ICD-10-CM | POA: Diagnosis not present

## 2022-10-12 DIAGNOSIS — R002 Palpitations: Secondary | ICD-10-CM | POA: Diagnosis not present

## 2022-10-12 DIAGNOSIS — R42 Dizziness and giddiness: Secondary | ICD-10-CM | POA: Diagnosis not present

## 2022-10-12 DIAGNOSIS — Z91013 Allergy to seafood: Secondary | ICD-10-CM | POA: Diagnosis not present

## 2022-10-12 DIAGNOSIS — R000 Tachycardia, unspecified: Secondary | ICD-10-CM | POA: Diagnosis not present

## 2022-10-12 DIAGNOSIS — E860 Dehydration: Secondary | ICD-10-CM | POA: Diagnosis not present

## 2022-10-12 DIAGNOSIS — F17210 Nicotine dependence, cigarettes, uncomplicated: Secondary | ICD-10-CM | POA: Diagnosis not present

## 2022-10-15 DIAGNOSIS — Z20822 Contact with and (suspected) exposure to covid-19: Secondary | ICD-10-CM | POA: Diagnosis not present

## 2022-10-15 DIAGNOSIS — R070 Pain in throat: Secondary | ICD-10-CM | POA: Diagnosis not present

## 2022-10-15 DIAGNOSIS — H6692 Otitis media, unspecified, left ear: Secondary | ICD-10-CM | POA: Diagnosis not present

## 2022-10-16 ENCOUNTER — Encounter: Payer: Self-pay | Admitting: Student

## 2022-10-19 ENCOUNTER — Other Ambulatory Visit: Payer: Self-pay

## 2022-10-19 ENCOUNTER — Encounter: Payer: Self-pay | Admitting: Student

## 2022-10-19 ENCOUNTER — Ambulatory Visit: Payer: Medicaid Other | Admitting: Student

## 2022-10-19 VITALS — BP 113/82 | HR 98 | Temp 97.6°F | Ht 66.0 in | Wt 137.5 lb

## 2022-10-19 DIAGNOSIS — F419 Anxiety disorder, unspecified: Secondary | ICD-10-CM

## 2022-10-19 DIAGNOSIS — H6690 Otitis media, unspecified, unspecified ear: Secondary | ICD-10-CM | POA: Diagnosis not present

## 2022-10-19 DIAGNOSIS — H6692 Otitis media, unspecified, left ear: Secondary | ICD-10-CM | POA: Diagnosis not present

## 2022-10-19 MED ORDER — FLUTICASONE PROPIONATE 50 MCG/ACT NA SUSP: 1.0000 | Freq: Every day | NASAL | 0 refills | Status: DC

## 2022-10-19 NOTE — Assessment & Plan Note (Signed)
Patient presenting with L ear pain, fullness since last weekend. Patient presented to UC where she was diagnosed with OM and was prescribed a course of Amoxicillin. She is still completing course. However, she has continued to experience pain. She is also experiencing nasal congestion. She denies fevers or otorrhea, pain in the outer ear or face. Patient took a one time dose of 200 mg of Ibuprofen, denies any other analgesic control   Exam is significant for mild discomfort with manipulation of the L auricle, ear canal without discharge or erythema, TM with yellow spot and mild erythema on the inferior aspect. No visible perforation. No tenderness to palpation over mastoid process bilaterally. No cervical tenderness. Clear mucus present in bilateral nostrils. No sinus tenderness  These findings are consistent with acute otitis media, likely viral > baterial given minimal relief with Amoxicillin course and concurrent nasal congestion. No complications noted on exam.  Plan: -finish Amoxicillin course -Ibuprofen 600 mg q8HRs -Flonase spray -Netipot for nasal and sinus flushing -Return precautions reviewed

## 2022-10-19 NOTE — Patient Instructions (Signed)
Thank you, Ms.Remee R Baiz for allowing [place] to provide your care today. Today we discussed   Ear infection Finish your amoxicillin course Increase ibuprofen to 600 mg every 8 hours as needed for next 5 days Start using 1 puff of flonase in each nare every day Buy the neti pot for nasal and sinus irrigation (instructions attached)   Anxiety Continue to take Hydroxyzine as needed. If you feel like your are taking this medication or are having more anxiety attacks or increased worries, please see [place] back and we can talk about other options  I have ordered the following labs for you:  My Chart Access: [email or web address]   Please make sure to arrive 15 minutes prior to your next appointment. If you arrive late, you may be asked to reschedule.    We look forward to seeing you next time. Please call our clinic at [phone] if you have any questions or concerns. The best time to call is Monday-Friday from 9am-4pm, but there is someone available 24/7. If after hours or the weekend, call the main hospital number and ask for the Internal Medicine Resident On-Call. If you need medication refills, please notify your pharmacy one week in advance and they will send [place] a request.   Thank you for letting us take part in your care. Wishing you the best!  Morene Crocker, MD 10/19/2022, 10:08 AM [facility] Internal Medicine Resident, PGY-1

## 2022-10-19 NOTE — Assessment & Plan Note (Signed)
Patient presented to ED after having an episode of anxiety attack with high HR. Was discharged with Hydroxyzine 25 mg PRN. Patient has not taken medications since she was discharged. She reports that her episodes are far and apart, and are usually following times when she is overwhelmed by different stressors. She has a strong family history of anxiety disorders in her maternal side.   GAD7 today score of 5, mild anxiety.  Discussed with patient need for symptom monitoring and that Hydroxyzine is a rescue medication but not likely to lower overall anxiety. Patient amenable to considering SSRI or other long term medications in the future -Continue Hydroxyzine PRN

## 2022-10-19 NOTE — Progress Notes (Signed)
Subjective:  CC: ear pain and anxiety  HPI:  [name] is a 21 y.o. female with a past medical history stated below and presents today for ear pain and anxiety follow up. Please see problem based assessment and plan for additional details.  Past Medical History:  Diagnosis Date   Chlamydia 08/03/2020   Treated 08/03/20 PCO________   History of enlarged tonsils    Hypoglycemia     Current Outpatient Medications on File Prior to Visit  Medication Sig Dispense Refill   albuterol (VENTOLIN HFA) 108 (90 Base) MCG/ACT inhaler Inhale 2 puffs into the lungs every 6 (six) hours as needed for wheezing or shortness of breath. 8 g 2   dicyclomine (BENTYL) 10 MG capsule Take 1 capsule (10 mg total) by mouth 3 (three) times daily before meals. 60 capsule 0   medroxyPROGESTERone (DEPO-PROVERA) 150 MG/ML injection Inject 1 mL (150 mg total) into the muscle every 3 (three) months. 1 mL 4   psyllium (METAMUCIL) 58.6 % packet Take 1 packet by mouth daily. 30 each 12   No current facility-administered medications on file prior to visit.    Family History  Problem Relation Age of Onset   Heart attack Father    Polycystic kidney disease Sister     Social History   Socioeconomic History   Marital status: Significant Other    Spouse name: Not on file   Number of children: 1   Years of education: Not on file   Highest education level: Not on file  Occupational History   Not on file  Tobacco Use   Smoking status: Former    Types: E-cigarettes   Smokeless tobacco: Never   Tobacco comments:    Vapes   Vaping Use   Vaping Use: Former   Substances: Nicotine, Flavoring  Substance and Sexual Activity   Alcohol use: Not Currently    Comment: Occasional   Drug use: Never   Sexual activity: Yes    Birth control/protection: Injection  Other Topics Concern   Not on file  Social History Narrative   Not on file   Social Determinants of Health   Financial Resource Strain: Low  Risk  (10/19/2022)   Overall Financial Resource Strain (CARDIA)    Difficulty of Paying Living Expenses: Not hard at all  Food Insecurity: No Food Insecurity (10/19/2022)   Hunger Vital Sign    Worried About Running Out of Food in the Last Year: Never true    Ran Out of Food in the Last Year: Never true  Transportation Needs: No Transportation Needs (10/19/2022)   PRAPARE - Administrator, Civil Service (Medical): No    Lack of Transportation (Non-Medical): No  Physical Activity: Inactive (10/19/2022)   Exercise Vital Sign    Days of Exercise per Week: 0 days    Minutes of Exercise per Session: 0 min  Stress: No Stress Concern Present (10/19/2022)   [employer] of Occupational Health - Occupational Stress Questionnaire    Feeling of Stress : Not at all  Social Connections: Moderately Isolated (10/19/2022)   Social Connection and Isolation Panel [NHANES]    Frequency of Communication with Friends and Family: More than three times a week    Frequency of Social Gatherings with Friends and Family: More than three times a week    Attends Religious Services: Never    Database administrator or Organizations: No    Attends Banker Meetings: Never    Marital  Status: Living with partner  Intimate Partner Violence: Not At Risk (10/19/2022)   Humiliation, Afraid, Rape, and Kick questionnaire    Fear of Current or Ex-Partner: No    Emotionally Abused: No    Physically Abused: No    Sexually Abused: No    Review of Systems: ROS negative except for what is noted on the assessment and plan.  Objective:   Vitals:   10/19/22 0923  BP: 113/82  Pulse: 98  Temp: 97.6 F (36.4 C)  TempSrc: Oral  SpO2: 100%  Weight: 137 lb 8 oz (62.4 kg)  Height: 5\' 6"  (1.676 m)    Physical Exam: Constitutional: well-appearing woman sitting in woman, in no acute distress HENT: normocephalic atraumatic, mucous membranes moist. No tenderness to palpation over mastoid process  bilaterally. No cervical tenderness. Clear mucus present in bilateral nostrils. No sinus tenderness L ear: Mild discomfort with manipulation of the L auricle, ear canal without discharge or erythema, TM with yellow spot and mild erythema on the inferior aspect. No visible perforation.  Eyes: conjunctiva non-erythematous Neck: supple Cardiovascular: regular rate and rhythm, no m/r/g Pulmonary/Chest: normal work of breathing on room air, lungs clear to auscultation bilaterally Abdominal: soft, non-tender, non-distended MSK: normal bulk and tone Neurological: alert & oriented x 3, 5/5 strength in bilateral upper and lower extremities, normal gait Skin: warm and dry Psych: appropriate mood and affect    Assessment & Plan:   Otitis media Patient presenting with L ear pain, fullness since last weekend. Patient presented to UC where she was diagnosed with OM and was prescribed a course of Amoxicillin. She is still completing course. However, she has continued to experience pain. She is also experiencing nasal congestion. She denies fevers or otorrhea, pain in the outer ear or face. Patient took a one time dose of 200 mg of Ibuprofen, denies any other analgesic control   Exam is significant for mild discomfort with manipulation of the L auricle, ear canal without discharge or erythema, TM with yellow spot and mild erythema on the inferior aspect. No visible perforation. No tenderness to palpation over mastoid process bilaterally. No cervical tenderness. Clear mucus present in bilateral nostrils. No sinus tenderness  These findings are consistent with acute otitis media, likely viral > baterial given minimal relief with Amoxicillin course and concurrent nasal congestion. No complications noted on exam.  Plan: -finish Amoxicillin course -Ibuprofen 600 mg q8HRs -Flonase spray -Netipot for nasal and sinus flushing -Return precautions reviewed  Anxiety disorder Patient presented to ED after having an  episode of anxiety attack with high HR. Was discharged with Hydroxyzine 25 mg PRN. Patient has not taken medications since she was discharged. She reports that her episodes are far and apart, and are usually following times when she is overwhelmed by different stressors. She has a strong family history of anxiety disorders in her maternal side.   GAD7 today score of 5, mild anxiety.  Discussed with patient need for symptom monitoring and that Hydroxyzine is a rescue medication but not likely to lower overall anxiety. Patient amenable to considering SSRI or other long term medications in the future -Continue Hydroxyzine PRN    Patient seen with Dr. 

## 2022-10-20 NOTE — Progress Notes (Signed)
Internal Medicine Clinic Attending  I saw and evaluated the patient.  I personally confirmed the key portions of the history and exam documented by Dr. Gomez-Caraballo and I reviewed pertinent patient test results.  The assessment, diagnosis, and plan were formulated together and I agree with the documentation in the resident's note.  

## 2022-10-23 ENCOUNTER — Encounter: Payer: Self-pay | Admitting: Student

## 2022-11-03 ENCOUNTER — Encounter: Payer: Self-pay | Admitting: Internal Medicine

## 2022-11-29 ENCOUNTER — Ambulatory Visit (INDEPENDENT_AMBULATORY_CARE_PROVIDER_SITE_OTHER): Payer: Medicaid Other | Admitting: *Deleted

## 2022-11-29 DIAGNOSIS — Z3042 Encounter for surveillance of injectable contraceptive: Secondary | ICD-10-CM

## 2022-11-29 MED ORDER — MEDROXYPROGESTERONE ACETATE 150 MG/ML IM SUSP
150.0000 mg | Freq: Once | INTRAMUSCULAR | Status: AC
  Administered 2022-11-29: 150 mg via INTRAMUSCULAR

## 2022-11-29 NOTE — Progress Notes (Signed)
   NURSE VISIT- INJECTION  SUBJECTIVE:  Elizabeth Larsen is a 22 y.o. G48P1001 female here for a Depo Provera for contraception/period management. She is a GYN patient.   OBJECTIVE:  There were no vitals taken for this visit.  Appears well, in no apparent distress  Injection administered in: Left deltoid  Meds ordered this encounter  Medications   medroxyPROGESTERone (DEPO-PROVERA) injection 150 mg    ASSESSMENT: GYN patient Depo Provera for contraception/period management PLAN: Follow-up: in 11-13 weeks for next Depo; schedule GYN appt for breast discharge and pain.    Levy Pupa  11/29/2022 4:09 PM

## 2022-12-01 ENCOUNTER — Encounter: Payer: Medicaid Other | Admitting: Internal Medicine

## 2022-12-01 NOTE — Progress Notes (Deleted)
IBS-C  ANXIETY: GAD-7 TODAY I S*, from 5 at last OV 09/2022. Current regimen is hydroxyzine 25 mg TID PRN.  Plan:  OT MED:

## 2022-12-05 ENCOUNTER — Ambulatory Visit: Payer: Medicaid Other | Admitting: Women's Health

## 2022-12-08 ENCOUNTER — Encounter: Payer: Self-pay | Admitting: Internal Medicine

## 2022-12-08 ENCOUNTER — Ambulatory Visit: Payer: Medicaid Other | Admitting: Internal Medicine

## 2022-12-08 ENCOUNTER — Other Ambulatory Visit (INDEPENDENT_AMBULATORY_CARE_PROVIDER_SITE_OTHER): Payer: Medicaid Other

## 2022-12-08 DIAGNOSIS — K5900 Constipation, unspecified: Secondary | ICD-10-CM

## 2022-12-08 DIAGNOSIS — R109 Unspecified abdominal pain: Secondary | ICD-10-CM

## 2022-12-08 DIAGNOSIS — R140 Abdominal distension (gaseous): Secondary | ICD-10-CM

## 2022-12-08 LAB — C-REACTIVE PROTEIN: CRP: <1 mg/dL (ref 0.5–20.0)

## 2022-12-08 LAB — LIPASE: Lipase: 21 U/L (ref 11.0–59.0)

## 2022-12-08 LAB — CORTISOL: Cortisol, Plasma: 6.3 ug/dL

## 2022-12-08 NOTE — Patient Instructions (Addendum)
_______________________________________________________  If your blood pressure at your visit was 140/90 or greater, please contact your primary care physician to follow up on this.  If you are age 25 or younger, your body mass index should be between 19-25. Your Body mass index is 21.63 kg/m. If this is out of the aformentioned range listed, please consider follow up with your Primary Care Provider.  ________________________________________________________  The [facility] providers would like to encourage you to use [facility] to communicate with providers for non-urgent requests or questions.  Due to long hold times on the telephone, sending your provider a message by [facility] may be a faster and more efficient way to get a response.  Please allow 48 business hours for a response.  Please remember that this is for non-urgent requests.  _______________________________________________________  Your provider has requested that you go to the basement level for lab work before leaving today. Press "B" on the elevator. The lab is located at the first door on the left as you exit the elevator.  Due to recent changes in healthcare laws, you may see the results of your imaging and laboratory studies on MyChart before your provider has had a chance to review them.  We understand that in some cases there may be results that are confusing or concerning to you. Not all laboratory results come back in the same time frame and the provider may be waiting for multiple results in order to interpret others.  Please give [place] 48 hours in order for your provider to thoroughly review all the results before contacting the office for clarification of your results.   Drink 8 cups of water a day and walk 30 minutes a day.  Please purchase the following medications over the counter and take as directed:  Fiber supplement such as Metamucil gummies Miralax: Take as  directed up to 3 times a day to achieve regular bowel  movements  You are scheduled to follow up in our office on 02-08-23 at 3:40pm.  Thank you for entrusting me with your care and choosing [facility].  Dr Lorenso Courier

## 2022-12-08 NOTE — Progress Notes (Signed)
Chief Complaint: Constipation, IBS  HPI : 22 year old female with history of hypoglycemia and IBS presents with constipation and IBS  She has had constipation since 07/2022. She has been labelled with IBS by her PCP, which can flare up and cause ab pain. She used to have BMs once a day, but then starting in 07/2022, she started noticing mucous and balls of stool that come out. She will feel like she has to pass gas but then mucous will come out. Denies rectal bleeding or melena. She uses hydroxyzine for anxiety PRN. Her ab pain is located in the right side and near her umbilicus. Ab pain will sometimes get better after having a BM. Food doesn't seem to affect the ab pain. Endorses some bloating. She can have loss of appetite when the pain comes on. Endorses some nausea. Denies vomiting. Denies chest burning or regurgitation. Denies dysphagia. Great grandmother had diabetes. Denies family history of GI issues. Denies prior EGD or colonoscopy. She drinks about 3 16 ounces of water per day. She has had episodes of hypoglycemia in the past, which have resolved on their own. There was never a clear etiology to her hypoglycemic episodes. Denies alcohol use.  Past Medical History:  Diagnosis Date   Chlamydia 08/03/2020   Treated 08/03/20 PCO________   History of enlarged tonsils    Hypoglycemia    IBS (irritable bowel syndrome)    Past Surgical History:  Procedure Laterality Date   TONSILLECTOMY AND ADENOIDECTOMY N/A 02/02/2020   Procedure: TONSILLECTOMY AND ADENOIDECTOMY;  Surgeon: Leta Baptist, MD;  Location: [place];  Service: ENT;  Laterality: N/A;   Family History  Problem Relation Age of Onset   Heart attack Father    Polycystic kidney disease Sister    Colon cancer Neg Hx    Esophageal cancer Neg Hx    Stomach cancer Neg Hx    Social History   Tobacco Use   Smoking status: Former    Types: E-cigarettes   Smokeless tobacco: Former   Tobacco comments:    Agricultural engineer Use: Former   Substances: Nicotine, Flavoring  Substance Use Topics   Alcohol use: Never    Comment: Occasional   Drug use: Never   Current Outpatient Medications  Medication Sig Dispense Refill   hydrOXYzine (ATARAX) 25 MG tablet Take 25 mg by mouth 3 (three) times daily as needed.     medroxyPROGESTERone (DEPO-PROVERA) 150 MG/ML injection Inject 1 mL (150 mg total) into the muscle every 3 (three) months. 1 mL 4   No current facility-administered medications for this visit.   Allergies  Allergen Reactions   Shellfish Allergy Other (See Comments)    Tongue started burning and bumps developed on tongue.   Shrimp (Diagnostic) Other (See Comments)    Tongue started burning and bumps developed on tongue.    Review of Systems: All systems reviewed and negative except where noted in HPI.   Physical Exam: BP 98/72   Pulse 87   Ht 5\' 6"  (1.676 m)   Wt 134 lb (60.8 kg)   BMI 21.63 kg/m  Constitutional: Pleasant,well-developed, female in no acute distress. HEENT: Normocephalic and atraumatic. Conjunctivae are normal. No scleral icterus. Cardiovascular: Normal rate, regular rhythm.  Pulmonary/chest: Effort normal and breath sounds normal. No wheezing, rales or rhonchi. Abdominal: Soft, nondistended, nontender. Quiet bowel sounds. There are no masses palpable. No hepatomegaly. Extremities: No edema Neurological: Alert and oriented to person place and time.  Skin: Skin is warm and dry. No rashes noted. Psychiatric: Normal mood and affect. Behavior is normal.  Labs 08/2022: Ferritin 36. TSH nml. TTG IgA neg. IgA nml.   Labs 09/2022: CBC nml. CMP with elevated Na of 147 and low K of 3.2. TSH nml.   RUQ U/S 09/25/22: IMPRESSION: No cholelithiasis or sonographic evidence for acute cholecystitis.  ASSESSMENT AND PLAN: Constipation Bloating Abdominal pain Patient presents with constipation and ab pain for the last several months, which has been attributed to IBS. Will  plan to start the patient on constipation therapies and obtain labs to rule out alternative etiologies of her ab pain.  - Drink 8 cups of water per day - Start daily Metamucil gummies - Start Miralax QD - Will obtain lipase, CRP, cortisol - If labs are normal, then plan for CT A/P w/contrast - RTC in 2 months  Christia Reading, MD

## 2022-12-09 ENCOUNTER — Encounter: Payer: Self-pay | Admitting: Internal Medicine

## 2022-12-11 ENCOUNTER — Other Ambulatory Visit: Payer: Self-pay

## 2022-12-11 DIAGNOSIS — K5900 Constipation, unspecified: Secondary | ICD-10-CM

## 2022-12-11 DIAGNOSIS — R109 Unspecified abdominal pain: Secondary | ICD-10-CM

## 2022-12-12 ENCOUNTER — Ambulatory Visit: Payer: Medicaid Other | Admitting: Women's Health

## 2022-12-12 ENCOUNTER — Other Ambulatory Visit: Payer: Self-pay

## 2022-12-12 ENCOUNTER — Encounter: Payer: Self-pay | Admitting: Women's Health

## 2022-12-12 ENCOUNTER — Other Ambulatory Visit (HOSPITAL_COMMUNITY)
Admission: RE | Admit: 2022-12-12 | Discharge: 2022-12-12 | Disposition: A | Discharge status: Home / Self Care | Payer: Medicaid Other | Source: Ambulatory Visit | Attending: Women's Health | Admitting: Women's Health

## 2022-12-12 VITALS — BP 116/80 | HR 94 | Ht 66.0 in | Wt 133.8 lb

## 2022-12-12 DIAGNOSIS — N644 Mastodynia: Secondary | ICD-10-CM

## 2022-12-12 DIAGNOSIS — N6452 Nipple discharge: Secondary | ICD-10-CM | POA: Insufficient documentation

## 2022-12-12 NOTE — Progress Notes (Signed)
Error

## 2022-12-12 NOTE — Addendum Note (Signed)
Addended by: Janece Canterbury on: 12/12/2022 10:28 AM   Modules accepted: Orders

## 2022-12-12 NOTE — Progress Notes (Signed)
GYN VISIT Patient name: Elizabeth Larsen MRN [id number]  Date of birth: 02-Sep-2001 Chief Complaint:   Breast Problem  History of Present Illness:   Elizabeth Larsen is a 22 y.o. G7P1001 Caucasian female being seen today for report of Lt breast pain and leaking milk.  When I saw her 06/12/22 she reported Rt breast pain, but today said she had them mixed up. She did get an u/s of Rt breast 06/20/22 and was normal. 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 Daughter was born 04/18/21, pumped x 102mth then stopped. Both breasts continue to leak milky fluid, but Lt >Rt. Lt breast pain is sharp, intermittent, maybe 2-3x/wk and whole breast hurts.   No LMP recorded. Patient has had an injection. The current method of family planning is Depo-Provera injections.  Last pap 09/07/20. Results were: NILM w/ HRHPV not done     10/19/2022    2:27 PM 10/19/2022    9:28 AM 10/11/2022    9:05 AM 09/11/2022   11:36 AM 06/12/2022   11:25 AM  Depression screen PHQ 2/9  Decreased Interest 0 0 0 0 0  Down, Depressed, Hopeless 0 0 0 0 0  PHQ - 2 Score 0 0 0 0 0  Altered sleeping   0 0 0  Tired, decreased energy   0 0 0  Change in appetite   0 0 0  Feeling bad or failure about yourself    0 0 0  Trouble concentrating   0 0 0  Moving slowly or fidgety/restless   0 0 0  Suicidal thoughts   0 0 0  PHQ-9 Score   0 0 0  Difficult doing work/chores   Not difficult at all Not difficult at all         10/19/2022    2:27 PM 06/12/2022   11:25 AM 01/27/2021    9:03 AM 10/13/2020   11:02 AM  GAD 7 : Generalized Anxiety Score  Nervous, Anxious, on Edge 1 0 0 0  Control/stop worrying 1 0 0 0  Worry too much - different things 1 0 0 0  Trouble relaxing 1 0 0 0  Restless 0 0 0 0  Easily annoyed or irritable 1 0 0 0  Afraid - awful might happen 0 0 0 0  Total GAD 7 Score 5 0 0 0  Anxiety Difficulty Somewhat difficult        Review of Systems:   Pertinent items are noted in HPI Denies  fever/chills, dizziness, headaches, visual disturbances, fatigue, shortness of breath, chest pain, abdominal pain, vomiting, abnormal vaginal discharge/itching/odor/irritation, problems with periods, bowel movements, urination, or intercourse unless otherwise stated above.  Pertinent History Reviewed:  Reviewed past medical,surgical, social, obstetrical and family history.  Reviewed problem list, medications and allergies. Physical Assessment:   Vitals:   12/12/22 0942  BP: 116/80  Pulse: 94  Weight: 133 lb 12.8 oz (60.7 kg)  Height: 5\' 6"  (1.676 m)  Body mass index is 21.6 kg/m.       Physical Examination:   General appearance: alert, well appearing, and in no distress  Mental status: alert, oriented to person, place, and time  Skin: warm & dry   Cardiovascular: normal heart rate noted  Respiratory: normal respiratory effort, no distress  Breasts - bilateral nipple inversion-states this is her normal. Right breast normal without mass, skin or nipple changes or axillary nodes, Lt breast w/ ~2cm thicker tissue at 12 o'clock ~2-67fb from nipple, small  amt leakage milky fluid from nipple w/ breast exam (obtained fluid on slide)  Abdomen: soft, non-tender   Pelvic: examination not indicated  Extremities: no edema   Chaperone: Celene Squibb    No results found for this or any previous visit (from the past 24 hour(s)).  Assessment & Plan:  1) Lt breast pain and milky discharge> slide of discharge for cytology, will get Lt breast u/s. Order entered and note routed to [facility] to schedule.   Meds: No orders of the defined types were placed in this encounter.   Orders Placed This Encounter  Procedures   US BREAST LTD UNI LEFT INC AXILLA    Return in about 6 months (around 06/12/2023) for Pap & physical.  Roma Schanz CNM, WHNP-BC 12/12/2022 10:12 AM

## 2022-12-15 LAB — CYTOLOGY - NON PAP

## 2022-12-20 ENCOUNTER — Telehealth: Payer: Self-pay

## 2022-12-20 NOTE — Telephone Encounter (Signed)
-----  Message from Sharyn Creamer, MD sent at 12/20/2022  1:27 PM EST ----- Regarding: RE: Antionette Poles to [place] If [employer] cannot complete review, then we may need to reschedule her CT ----- Message ----- From: Greggory Keen, LPN Sent: 03/28/3266  10:36 AM EST To: Sharyn Creamer, MD Subject: [facility]                               For your review. ----- Message ----- FromBartholomew Crews Sent: 12/19/2022  10:01 AM EST To: Greggory Keen, LPN Subject: Antionette Poles to [place] you are well. This pt is having a CT scan done Friday- I called [employer] this morning and the case is still open. Not sure when they will have a decision. He said the nurses were behind. To get an answer quicker Dr. Lorenso Courier can do a pier to [employer]. The number is [phone] - opt #1- [id number]. Thank you.

## 2022-12-20 NOTE — Telephone Encounter (Signed)
Called the patient to make her aware of the situation. Explained that she may be contacted by Radiology Scheduling and her appointment be canceled. Patient will ask to move the CT scan appointment to another day to allow time for her insurance to process the authorization.

## 2022-12-21 ENCOUNTER — Encounter: Payer: Self-pay | Admitting: Internal Medicine

## 2022-12-21 ENCOUNTER — Other Ambulatory Visit: Payer: Self-pay

## 2022-12-21 MED ORDER — DICYCLOMINE HCL 20 MG PO TABS: 20.0000 mg | ORAL_TABLET | Freq: Three times a day (TID) | ORAL | 0 refills | Status: DC | PRN

## 2022-12-22 ENCOUNTER — Ambulatory Visit (HOSPITAL_COMMUNITY): Payer: Medicaid Other

## 2022-12-31 ENCOUNTER — Encounter: Payer: Self-pay | Admitting: Internal Medicine

## 2023-01-01 ENCOUNTER — Other Ambulatory Visit: Payer: Self-pay

## 2023-01-01 ENCOUNTER — Telehealth: Payer: Self-pay

## 2023-01-01 MED ORDER — LIDOCAINE VISCOUS HCL 2 % MT SOLN: 5.0000 mL | Freq: Four times a day (QID) | OROMUCOSAL | 0 refills | Status: DC | PRN

## 2023-01-01 NOTE — Telephone Encounter (Signed)
This is the patient who has had to delay her CT once already due to insurance.  They want peer to peer before they with authorize it. Call them at [phone]-- Case# [id number]

## 2023-01-01 NOTE — Telephone Encounter (Signed)
This patient's CT was rescheduled to 01/04/23. Has her insurance responded yet?

## 2023-01-02 ENCOUNTER — Encounter: Payer: Self-pay | Admitting: Women's Health

## 2023-01-02 ENCOUNTER — Ambulatory Visit (HOSPITAL_COMMUNITY)
Admission: RE | Admit: 2023-01-02 | Discharge: 2023-01-02 | Disposition: A | Discharge status: Home / Self Care | Payer: Medicaid Other | Source: Ambulatory Visit | Attending: Women's Health | Admitting: Women's Health

## 2023-01-02 DIAGNOSIS — N6452 Nipple discharge: Secondary | ICD-10-CM | POA: Diagnosis not present

## 2023-01-02 DIAGNOSIS — N644 Mastodynia: Secondary | ICD-10-CM | POA: Insufficient documentation

## 2023-01-04 ENCOUNTER — Ambulatory Visit (HOSPITAL_COMMUNITY)
Admission: RE | Admit: 2023-01-04 | Discharge: 2023-01-04 | Disposition: A | Discharge status: Home / Self Care | Payer: Medicaid Other | Source: Ambulatory Visit | Attending: Internal Medicine | Admitting: Internal Medicine

## 2023-01-04 DIAGNOSIS — R109 Unspecified abdominal pain: Secondary | ICD-10-CM | POA: Insufficient documentation

## 2023-01-04 DIAGNOSIS — K5900 Constipation, unspecified: Secondary | ICD-10-CM | POA: Insufficient documentation

## 2023-01-04 MED ORDER — IOHEXOL 300 MG/ML  SOLN
80.0000 mL | Freq: Once | INTRAMUSCULAR | Status: AC | PRN
  Administered 2023-01-04: 80 mL via INTRAVENOUS

## 2023-01-04 MED ORDER — SODIUM CHLORIDE (PF) 0.9 % IJ SOLN
INTRAMUSCULAR | Status: AC
  Filled 2023-01-04: qty 50

## 2023-01-05 ENCOUNTER — Encounter: Payer: Self-pay | Admitting: Internal Medicine

## 2023-01-08 NOTE — Telephone Encounter (Signed)
Spoke to the patient about the results of her CT scan, which showed proctitis. 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 She has only started the fiber supplements without significant improvement in her constipation. I encouraged her to start daily Miralax. We will plan for a colonoscopy for further evaluation of her proctitis. Please call her and offer her an open [place] date for colonoscopy for proctitis.

## 2023-01-09 ENCOUNTER — Telehealth: Payer: Self-pay | Admitting: Internal Medicine

## 2023-01-09 ENCOUNTER — Other Ambulatory Visit: Payer: Self-pay | Admitting: Women's Health

## 2023-01-09 DIAGNOSIS — N6452 Nipple discharge: Secondary | ICD-10-CM

## 2023-01-09 NOTE — Telephone Encounter (Signed)
PT was scheduled for colonoscopy and needs it rescheduled because she has to work. She wants something on  3/11 or 3/13. Please advise

## 2023-01-09 NOTE — Telephone Encounter (Signed)
Called the patient back. Those 2 date are not [facility] procedure dates for Dr Lorenso Courier. Asked the patient to call back and ask to reschedule to something that will work for her.

## 2023-01-10 ENCOUNTER — Encounter: Payer: Self-pay | Admitting: Internal Medicine

## 2023-01-16 ENCOUNTER — Ambulatory Visit (AMBULATORY_SURGERY_CENTER): Payer: Medicaid Other | Admitting: *Deleted

## 2023-01-16 VITALS — Ht 66.0 in | Wt 134.0 lb

## 2023-01-16 DIAGNOSIS — K5900 Constipation, unspecified: Secondary | ICD-10-CM

## 2023-01-16 MED ORDER — PLENVU 140 G PO SOLR: 1.0000 | ORAL | 0 refills | Status: DC

## 2023-01-16 NOTE — Progress Notes (Signed)
No egg or soy allergy known to patient  No issues known to pt with past sedation with any surgeries or procedures Patient denies ever being intubated No issues with moving head or neck No issues with swallowing No FH of Malignant Hyperthermia Pt is not on diet pills Pt is not on  home 02  Pt is not on blood thinners  Pt issues with constipation Pt uses fiber gummies and Miralax Instructed to use Miralax on ly for five days prior to procedure Pt is not on dialysis Pt denies any upcoming cardiac testing Pt encouraged to use to use Singlecare or Goodrx to reduce cost  Patient's chart reviewed by Osvaldo Angst CNRA prior to previsit and patient appropriate for the [place].  Previsit completed and red dot placed by patient's name on their procedure day (on provider's schedule).   Visit by phone Instructions reviewed with pt and pt states understanding. Instructed to review again prior to procedure. Pt states they will.

## 2023-01-23 ENCOUNTER — Encounter: Payer: Self-pay | Admitting: Internal Medicine

## 2023-01-29 ENCOUNTER — Encounter: Payer: Self-pay | Admitting: Student

## 2023-01-29 ENCOUNTER — Encounter: Payer: Self-pay | Admitting: Internal Medicine

## 2023-01-30 ENCOUNTER — Other Ambulatory Visit: Payer: Self-pay | Admitting: Women's Health

## 2023-01-30 ENCOUNTER — Encounter: Payer: Self-pay | Admitting: Women's Health

## 2023-01-30 NOTE — Telephone Encounter (Signed)
Thanks for calling the patient we can wait for GYN response. Please have her check her sugar if she thinks this is similar to other low sugar events, and ED precautions of this seems more cardiac although low risk given her age.

## 2023-01-30 NOTE — Telephone Encounter (Signed)
I called pt who stated she has been having anxiety attacks; with increase heart rate. She thinks it maybe from her birth control medication - she has contacted her GYN doctor but has not heard back yet. Stated she's not taking any other medications. Stated she does not want to be put on anti-anxiety med. She does not want to schedule an appt at our office yet; she wants to wait until she hears from her GYN doctor.

## 2023-01-31 ENCOUNTER — Encounter: Payer: Self-pay | Admitting: Student

## 2023-02-01 ENCOUNTER — Telehealth: Payer: Self-pay | Admitting: Internal Medicine

## 2023-02-01 NOTE — Telephone Encounter (Signed)
Vomited prep - poured it down the drain and did n ot want tp try alternate  0730 colonoscopy tomorrow is cancelled  She has f/u w/ CD next week

## 2023-02-02 ENCOUNTER — Encounter: Payer: Medicaid Other | Admitting: Internal Medicine

## 2023-02-05 ENCOUNTER — Other Ambulatory Visit: Payer: Self-pay | Admitting: Women's Health

## 2023-02-05 MED ORDER — SLYND 4 MG PO TABS: 1.0000 | ORAL_TABLET | Freq: Every day | ORAL | 3 refills | Status: DC

## 2023-02-06 DIAGNOSIS — N6452 Nipple discharge: Secondary | ICD-10-CM | POA: Diagnosis not present

## 2023-02-07 LAB — PROLACTIN: Prolactin: 7.5 ng/mL (ref 4.8–33.4)

## 2023-02-08 ENCOUNTER — Ambulatory Visit: Payer: Medicaid Other | Admitting: Internal Medicine

## 2023-02-08 ENCOUNTER — Encounter: Payer: Self-pay | Admitting: Internal Medicine

## 2023-02-08 VITALS — BP 102/68 | HR 93 | Ht 66.0 in | Wt 130.0 lb

## 2023-02-08 DIAGNOSIS — K5900 Constipation, unspecified: Secondary | ICD-10-CM

## 2023-02-08 DIAGNOSIS — K6289 Other specified diseases of anus and rectum: Secondary | ICD-10-CM

## 2023-02-08 NOTE — Progress Notes (Signed)
Chief Complaint: Constipation, IBS  HPI : 22 year old female with history of hypoglycemia and IBS presents for follow up of constipation and IBS  Interval History: Patient overall has been doing slightly better since her last clinic appointment.  She has not been as bloated. Her stomach pain has improved slightly. The Miralax has made the biggest difference helping with her pain and bloating. She takes it on occasion currently.  On average she has 1 BM once every 2 days. She is planning to switch birth control and will see if this makes a difference with her bowel habits as well.  She feels pretty well currently. Denies rectal bleeding. Mucous from her rectum has decreased.  She did try to consume the laxative preparation for the colonoscopy but developed severe nausea and vomiting.  Thus her colonoscopy was canceled.  She presents with her 52-year-old daughter today who is going to turn [age] in May.  Current Outpatient Medications  Medication Sig Dispense Refill   Drospirenone (SLYND) 4 MG TABS Take 1 tablet (4 mg total) by mouth daily. 90 tablet 3   hydrOXYzine (ATARAX) 25 MG tablet Take 25 mg by mouth 3 (three) times daily as needed.     magic mouthwash (lidocaine, diphenhydrAMINE, alum & mag hydroxide) suspension Swish and swallow 5 mLs 4 (four) times daily as needed for mouth pain. 240 mL 0   dicyclomine (BENTYL) 20 MG tablet Take 1 tablet (20 mg total) by mouth 3 (three) times daily as needed for spasms. Diarrhea or abdominal pain (Patient not taking: Reported on 01/16/2023) 90 tablet 0   PEG-KCl-NaCl-NaSulf-Na Asc-C (PLENVU) 140 g SOLR Take 1 kit by mouth as directed. (Patient not taking: Reported on 02/08/2023) 1 each 0   No current facility-administered medications for this visit.   Physical Exam: BP 102/68   Pulse 93   Ht 5\' 6"  (1.676 m)   Wt 130 lb (59 kg)   SpO2 97%   BMI 20.98 kg/m  Constitutional: Pleasant,well-developed, female in no acute distress. HEENT: Normocephalic and  atraumatic. Conjunctivae are normal. No scleral icterus. Cardiovascular: Normal rate, regular rhythm.  Pulmonary/chest: Effort normal and breath sounds normal. No wheezing, rales or rhonchi. Abdominal: Soft, nondistended, nontender. Quiet bowel sounds. There are no masses palpable. No hepatomegaly. Extremities: No edema Neurological: Alert and oriented to person place and time. Skin: Skin is warm and dry. No rashes noted. Psychiatric: Normal mood and affect. Behavior is normal.  Labs 08/2022: Ferritin 36. TSH nml. TTG IgA neg. IgA nml.   Labs 09/2022: CBC nml. CMP with elevated Na of 147 and low K of 3.2. TSH nml.   Labs 11/2022: Lipase nml. CRP nml. Cortisol nml.   RUQ U/S 09/25/22: IMPRESSION: No cholelithiasis or sonographic evidence for acute cholecystitis.  CT A/P w/contrast 01/04/23: IMPRESSION: 1. Mild rectal wall thickening and adjacent stranding, may reflect proctitis. 2. Moderate volume of stool in the proximal colon.  ASSESSMENT AND PLAN: Constipation Bloating Abdominal pain Patient presents with improvement in her constipation, bloating, and abdominal pain as a result of MiraLAX therapy.  Her recent CT scan did show some mild rectal wall thickening that was suggestive of proctitis.  This proctitis may be explained by stercoral colitis due to underlying constipation.  I originally wanted to perform a colonoscopy in order to rule out underlying inflammatory bowel disease, but since the patient has had improvement in her symptoms, will hold off on rescheduling her colonoscopy for now.  Per patient preference, we will plan to check in  with her in 2 months to see how she is doing symptomatically. - Continue drinking at least 8 cups of water per day - Cont Miralax QD PRN - RTC in 2 months  Christia Reading, MD  I spent 33 minutes of time, including in depth chart review, independent review of results as outlined above, communicating results with the patient directly, face-to-face  time with the patient, coordinating care, and ordering studies and medications as appropriate, and documentation.

## 2023-02-08 NOTE — Patient Instructions (Signed)
Continue Miralax daily Follow up in 2 months  Drink 8 cups of water per day  If your blood pressure at your visit was 140/90 or greater, please contact your primary care physician to follow up on this.  _______________________________________________________  If you are age 25 or older, your body mass index should be between 23-30. Your Body mass index is 20.98 kg/m. If this is out of the aforementioned range listed, please consider follow up with your Primary Care Provider.  If you are age 22 or younger, your body mass index should be between 19-25. Your Body mass index is 20.98 kg/m. If this is out of the aformentioned range listed, please consider follow up with your Primary Care Provider.   ________________________________________________________  The [facility] providers would like to encourage you to use [facility] to communicate with providers for non-urgent requests or questions.  Due to long hold times on the telephone, sending your provider a message by [facility] may be a faster and more efficient way to get a response.  Please allow 48 business hours for a response.  Please remember that this is for non-urgent requests.  _____________________________________________________  Thank you for entrusting me with your care and for choosing [facility], Dr. Christia Reading

## 2023-02-21 ENCOUNTER — Ambulatory Visit: Payer: Medicaid Other

## 2023-02-23 ENCOUNTER — Telehealth: Payer: Self-pay | Admitting: *Deleted

## 2023-02-23 NOTE — Telephone Encounter (Signed)
Pt feels a knot in vaginal area; it's sore. Not red. Advised needs to be seen. Pt voiced understanding and call was transferred to [facility]. JSY

## 2023-02-26 ENCOUNTER — Ambulatory Visit: Payer: Medicaid Other | Admitting: Obstetrics & Gynecology

## 2023-02-26 ENCOUNTER — Encounter: Payer: Self-pay | Admitting: Obstetrics & Gynecology

## 2023-02-26 VITALS — BP 122/78 | HR 75 | Ht 66.0 in | Wt 131.0 lb

## 2023-02-26 DIAGNOSIS — N9089 Other specified noninflammatory disorders of vulva and perineum: Secondary | ICD-10-CM

## 2023-02-26 NOTE — Progress Notes (Signed)
Chief Complaint  Patient presents with   "bean size" knot to outside left labia    Not getting bigger, tender      21 y.o. G1P1001 No LMP recorded. (Menstrual status: Oral contraceptives). The current method of family planning is slynd.  Outpatient Encounter Medications as of 02/26/2023  Medication Sig Note   Drospirenone (SLYND) 4 MG TABS Take 1 tablet (4 mg total) by mouth daily.    hydrOXYzine (ATARAX) 25 MG tablet Take 25 mg by mouth 3 (three) times daily as needed. 12/08/2022: As needed   dicyclomine (BENTYL) 20 MG tablet Take 1 tablet (20 mg total) by mouth 3 (three) times daily as needed for spasms. Diarrhea or abdominal pain (Patient not taking: Reported on 01/16/2023)    magic mouthwash (lidocaine, diphenhydrAMINE, alum & mag hydroxide) suspension Swish and swallow 5 mLs 4 (four) times daily as needed for mouth pain. (Patient not taking: Reported on 02/26/2023)    PEG-KCl-NaCl-NaSulf-Na Asc-C (PLENVU) 140 g SOLR Take 1 kit by mouth as directed. (Patient not taking: Reported on 02/08/2023)    No facility-administered encounter medications on file as of 02/26/2023.    Subjective Pt was concerned regarding a "bean" sized bump on her vulva A little tender No drainage no fever  Past Medical History:  Diagnosis Date   Anxiety    Chlamydia 08/03/2020   Treated 08/03/20 PCO________   History of enlarged tonsils    Hypoglycemia    IBS (irritable bowel syndrome)     Past Surgical History:  Procedure Laterality Date   TONSILLECTOMY AND ADENOIDECTOMY N/A 02/02/2020   Procedure: TONSILLECTOMY AND ADENOIDECTOMY;  Surgeon: Newman Pies, MD;  Location: [facility];  Service: ENT;  Laterality: N/A;    OB History     Gravida  1   Para  1   Term  1   Preterm  0   AB  0   Living  1      SAB  0   IAB  0   Ectopic  0   Multiple  0   Live Births  1           Allergies  Allergen Reactions   Shellfish Allergy Other (See Comments)    Tongue started  burning and bumps developed on tongue.   Shrimp (Diagnostic) Other (See Comments)    Tongue started burning and bumps developed on tongue.    Social History   Socioeconomic History   Marital status: Significant Other    Spouse name: Not on file   Number of children: 1   Years of education: Not on file   Highest education level: Not on file  Occupational History   Occupation: receptionist  Tobacco Use   Smoking status: Former    Types: E-cigarettes   Smokeless tobacco: Former   Tobacco comments:    Conservation officer, nature Use: Former   Substances: Nicotine, Flavoring  Substance and Sexual Activity   Alcohol use: Never    Comment: Occasional   Drug use: Never   Sexual activity: Yes    Birth control/protection: None, Pill  Other Topics Concern   Not on file  Social History Narrative   Not on file   Social Determinants of Health   Financial Resource Strain: Low Risk  (10/19/2022)   Overall Financial Resource Strain (CARDIA)    Difficulty of Paying Living Expenses: Not hard at all  Food Insecurity: No Food Insecurity (10/19/2022)   Hunger  Vital Sign    Worried About Programme researcher, broadcasting/film/video in the Last Year: Never true    Ran Out of Food in the Last Year: Never true  Transportation Needs: No Transportation Needs (10/19/2022)   PRAPARE - Administrator, Civil Service (Medical): No    Lack of Transportation (Non-Medical): No  Physical Activity: Inactive (10/19/2022)   Exercise Vital Sign    Days of Exercise per Week: 0 days    Minutes of Exercise per Session: 0 min  Stress: No Stress Concern Present (10/19/2022)   [employer] of Occupational Health - Occupational Stress Questionnaire    Feeling of Stress : Not at all  Social Connections: Moderately Isolated (10/19/2022)   Social Connection and Isolation Panel [NHANES]    Frequency of Communication with Friends and Family: More than three times a week    Frequency of Social Gatherings with Friends and  Family: More than three times a week    Attends Religious Services: Never    Database administrator or Organizations: No    Attends Engineer, structural: Never    Marital Status: Living with partner    Family History  Problem Relation Age of Onset   Heart attack Father    Polycystic kidney disease Sister    Colon cancer Neg Hx    Esophageal cancer Neg Hx    Stomach cancer Neg Hx    Colon polyps Neg Hx    Rectal cancer Neg Hx     Medications:       Current Outpatient Medications:    Drospirenone (SLYND) 4 MG TABS, Take 1 tablet (4 mg total) by mouth daily., Disp: 90 tablet, Rfl: 3   hydrOXYzine (ATARAX) 25 MG tablet, Take 25 mg by mouth 3 (three) times daily as needed., Disp: , Rfl:    dicyclomine (BENTYL) 20 MG tablet, Take 1 tablet (20 mg total) by mouth 3 (three) times daily as needed for spasms. Diarrhea or abdominal pain (Patient not taking: Reported on 01/16/2023), Disp: 90 tablet, Rfl: 0   magic mouthwash (lidocaine, diphenhydrAMINE, alum & mag hydroxide) suspension, Swish and swallow 5 mLs 4 (four) times daily as needed for mouth pain. (Patient not taking: Reported on 02/26/2023), Disp: 240 mL, Rfl: 0   PEG-KCl-NaCl-NaSulf-Na Asc-C (PLENVU) 140 g SOLR, Take 1 kit by mouth as directed. (Patient not taking: Reported on 02/08/2023), Disp: 1 each, Rfl: 0  Objective Blood pressure 122/78, pulse 75, height 5\' 6"  (1.676 m), weight 131 lb (59.4 kg), not currently breastfeeding.  Normal exam, no erythema tenderness or swelling noted  Pertinent ROS No burning with urination, frequency or urgency No nausea, vomiting or diarrhea Nor fever chills or other constitutional symptoms   Labs or studies     Impression + Management Plan: Diagnoses this Encounter::   ICD-10-CM   1. Vulval lesion: resolved  N90.89         Medications prescribed during  this encounter: No orders of the defined types were placed in this encounter.   Labs or Scans Ordered during this  encounter: No orders of the defined types were placed in this encounter.     Follow up Return if symptoms worsen or fail to improve.

## 2023-03-19 ENCOUNTER — Encounter: Payer: Self-pay | Admitting: Women's Health

## 2023-04-06 ENCOUNTER — Encounter: Payer: Self-pay | Admitting: Adult Health

## 2023-04-06 ENCOUNTER — Ambulatory Visit (INDEPENDENT_AMBULATORY_CARE_PROVIDER_SITE_OTHER): Payer: Medicaid Other | Admitting: Adult Health

## 2023-04-06 VITALS — BP 125/77 | HR 88 | Ht 66.0 in | Wt 130.0 lb

## 2023-04-06 DIAGNOSIS — N926 Irregular menstruation, unspecified: Secondary | ICD-10-CM

## 2023-04-06 DIAGNOSIS — Z3202 Encounter for pregnancy test, result negative: Secondary | ICD-10-CM | POA: Insufficient documentation

## 2023-04-06 DIAGNOSIS — Z30013 Encounter for initial prescription of injectable contraceptive: Secondary | ICD-10-CM | POA: Diagnosis not present

## 2023-04-06 LAB — POCT URINE PREGNANCY: Preg Test, Ur: NEGATIVE

## 2023-04-06 MED ORDER — MEDROXYPROGESTERONE ACETATE 150 MG/ML IM SUSP: 150.0000 mg | INTRAMUSCULAR | 4 refills | Status: DC

## 2023-04-06 MED ORDER — MEDROXYPROGESTERONE ACETATE 150 MG/ML IM SUSY
150.0000 mg | PREFILLED_SYRINGE | Freq: Once | INTRAMUSCULAR | Status: AC
  Administered 2023-04-06: 150 mg via INTRAMUSCULAR

## 2023-04-06 NOTE — Progress Notes (Signed)
  Subjective:     Patient ID: Elizabeth Larsen, female   DOB: 05-06-2001, 22 y.o.   MRN: [id number]  HPI Elizabeth Larsen is a 22 year old white female, single, G1P1001 in to discuss birth control, she stopped slynd last week. Periods irregular      Component Value Date/Time   DIAGPAP  09/07/2020 1118    - Negative for intraepithelial lesion or malignancy (NILM)   ADEQPAP Satisfactory for evaluation.  The absence of an 09/07/2020 1118   ADEQPAP  09/07/2020 1118    endocervical/transformation zone component is not uncommon in pregnant   ADEQPAP patients. 09/07/2020 1118    Review of Systems +irregular periods Denies MI,stroke, DVT breast cancer or migraine with auras  Reviewed past medical,surgical, social and family history. Reviewed medications and allergies.     Objective:   Physical Exam BP 125/77 (BP Location: Left Arm, Patient Position: Sitting, Cuff Size: Normal)   Pulse 88   Ht 5\' 6"  (1.676 m)   Wt 130 lb (59 kg)   Breastfeeding No   BMI 20.98 kg/m  UPT is negative Skin warm and dry. Lungs: clear to ausculation bilaterally. Cardiovascular: regular rate and rhythm.      Upstream - 04/06/23 1313       Pregnancy Intention Screening   Does the patient want to become pregnant in the next year? No    Does the patient's partner want to become pregnant in the next year? No    Would the patient like to discuss contraceptive options today? Yes      Contraception Wrap Up   Current Method No Method - Other Reason    Reason for No Current Contraceptive Method at Intake ([facility] Only) Other    End Method Hormonal Injection    Contraception Counseling Provided Yes    How was the end contraceptive method provided? Provided on site   rx sent too            Assessment:     1. Pregnancy examination or test, negative result - POCT urine pregnancy  2. Irregular periods   3. Encounter for initial prescription of injectable contraceptive Discussed options and she wants depo, first  injection given in office today Use condoms for 2 weeks  Meds ordered this encounter  Medications   medroxyPROGESTERone (DEPO-PROVERA) 150 MG/ML injection    Sig: Inject 1 mL (150 mg total) into the muscle every 3 (three) months.    Dispense:  1 mL    Refill:  4    Order Specific Question:   Supervising Provider    Answer:   Lazaro Arms [[id number]]       Plan:     Follow up in 12 weeks for next depo

## 2023-04-06 NOTE — Addendum Note (Signed)
Addended by: Colen Darling on: 04/06/2023 01:33 PM   Modules accepted: Orders

## 2023-04-25 ENCOUNTER — Encounter: Payer: Self-pay | Admitting: *Deleted

## 2023-06-29 ENCOUNTER — Ambulatory Visit: Payer: Medicaid Other

## 2023-07-06 ENCOUNTER — Telehealth: Payer: Self-pay | Admitting: *Deleted

## 2023-07-06 ENCOUNTER — Other Ambulatory Visit: Payer: Self-pay | Admitting: Advanced Practice Midwife

## 2023-07-06 ENCOUNTER — Ambulatory Visit: Payer: Medicaid Other

## 2023-07-06 MED ORDER — NORELGESTROMIN-ETH ESTRADIOL 150-35 MCG/24HR TD PTWK: 1.0000 | MEDICATED_PATCH | TRANSDERMAL | 12 refills | Status: DC

## 2023-07-06 NOTE — Progress Notes (Signed)
Pt desires to switch to patch from Depo; Depo given within past 3 months; Xulane sent in with refills x 53yr  [name] [facility] 07/06/2023 10:53 AM

## 2023-07-06 NOTE — Telephone Encounter (Signed)
Pt came if for an appointment today that was scheduled incorrectly. Patient just wants to get started on birth control patch. She is currently on depo provera. Discussed with Philipp Deputy and she agreed to send in rx for patch to [facility] pharmacy. Pt is aware to check at pharmacy later today.

## 2023-08-02 DIAGNOSIS — J029 Acute pharyngitis, unspecified: Secondary | ICD-10-CM | POA: Diagnosis not present

## 2023-08-02 DIAGNOSIS — R051 Acute cough: Secondary | ICD-10-CM | POA: Diagnosis not present

## 2023-08-09 DIAGNOSIS — F419 Anxiety disorder, unspecified: Secondary | ICD-10-CM | POA: Diagnosis not present

## 2023-08-09 DIAGNOSIS — Z6821 Body mass index (BMI) 21.0-21.9, adult: Secondary | ICD-10-CM | POA: Diagnosis not present

## 2023-08-14 ENCOUNTER — Telehealth: Payer: Self-pay | Admitting: Advanced Practice Midwife

## 2023-08-14 ENCOUNTER — Other Ambulatory Visit: Payer: Self-pay | Admitting: *Deleted

## 2023-08-14 MED ORDER — NORELGESTROMIN-ETH ESTRADIOL 150-35 MCG/24HR TD PTWK: 1.0000 | MEDICATED_PATCH | TRANSDERMAL | 12 refills | Status: DC

## 2023-08-14 NOTE — Telephone Encounter (Signed)
Patient would like her b/c patch transferred to [facility] in [place]. [facility] doesn't have it in stock and doesn't know when they will get it. She would like [facility] to be taken off and [facility] added as [facility]. Please advise.

## 2023-08-24 DIAGNOSIS — Z23 Encounter for immunization: Secondary | ICD-10-CM | POA: Diagnosis not present

## 2023-08-25 DIAGNOSIS — Z020 Encounter for examination for admission to educational institution: Secondary | ICD-10-CM | POA: Diagnosis not present

## 2023-08-29 DIAGNOSIS — Z23 Encounter for immunization: Secondary | ICD-10-CM | POA: Diagnosis not present

## 2023-09-02 DIAGNOSIS — Z23 Encounter for immunization: Secondary | ICD-10-CM | POA: Diagnosis not present

## 2023-09-03 ENCOUNTER — Encounter: Payer: Self-pay | Admitting: Women's Health

## 2023-10-23 ENCOUNTER — Ambulatory Visit: Payer: Medicaid Other | Admitting: Women's Health

## 2023-11-07 ENCOUNTER — Ambulatory Visit: Payer: Medicaid Other | Admitting: Women's Health

## 2023-12-10 ENCOUNTER — Ambulatory Visit: Payer: Medicaid Other | Admitting: Women's Health

## 2023-12-10 DIAGNOSIS — J029 Acute pharyngitis, unspecified: Secondary | ICD-10-CM | POA: Diagnosis not present

## 2023-12-10 DIAGNOSIS — R509 Fever, unspecified: Secondary | ICD-10-CM | POA: Diagnosis not present

## 2023-12-10 DIAGNOSIS — J069 Acute upper respiratory infection, unspecified: Secondary | ICD-10-CM | POA: Diagnosis not present

## 2023-12-11 DIAGNOSIS — R509 Fever, unspecified: Secondary | ICD-10-CM | POA: Diagnosis not present

## 2023-12-11 DIAGNOSIS — J029 Acute pharyngitis, unspecified: Secondary | ICD-10-CM | POA: Diagnosis not present

## 2023-12-11 DIAGNOSIS — U071 COVID-19: Secondary | ICD-10-CM | POA: Diagnosis not present

## 2024-01-02 ENCOUNTER — Other Ambulatory Visit (HOSPITAL_COMMUNITY)
Admission: RE | Admit: 2024-01-02 | Discharge: 2024-01-02 | Disposition: A | Discharge status: Home / Self Care | Payer: Medicaid Other | Source: Ambulatory Visit | Attending: Women's Health | Admitting: Women's Health

## 2024-01-02 ENCOUNTER — Encounter: Payer: Self-pay | Admitting: Women's Health

## 2024-01-02 ENCOUNTER — Ambulatory Visit: Payer: Medicaid Other | Admitting: Women's Health

## 2024-01-02 VITALS — BP 119/85 | HR 79 | Ht 66.0 in | Wt 130.0 lb

## 2024-01-02 DIAGNOSIS — N6459 Other signs and symptoms in breast: Secondary | ICD-10-CM

## 2024-01-02 DIAGNOSIS — Z30011 Encounter for initial prescription of contraceptive pills: Secondary | ICD-10-CM

## 2024-01-02 DIAGNOSIS — Z01419 Encounter for gynecological examination (general) (routine) without abnormal findings: Secondary | ICD-10-CM

## 2024-01-02 MED ORDER — LO LOESTRIN FE 1 MG-10 MCG / 10 MCG PO TABS: 1.0000 | ORAL_TABLET | Freq: Every day | ORAL | 3 refills | Status: DC

## 2024-01-02 NOTE — Progress Notes (Signed)
WELL-WOMAN EXAMINATION Patient name: Elizabeth Larsen MRN [id number]  Date of birth: [birth year] Chief Complaint:   Gynecologic Exam (Last pap 09-07-20 normal)  History of Present Illness:   Elizabeth Larsen is a 23 y.o. G34P1001 Caucasian female being seen today for a routine well-woman exam.  Current complaints: both nipples itchy x 2wks and has noticed an odor when taking bra off, tried Jergens lotion, but didn't help. Milk just finally completely dried up in Dec. Stopped breastfeeding 6wks after baby born (04/18/21). Continued leaking milk from both sides until Dec 2024.  Doesn't like patches anymore, they are peeling off. Wants to go back to LoLo, stopped her periods in past. Does not smoke, no h/o HTN, DVT/PE, CVA, MI, or migraines w/ aura.  Mom and Gma had some kind of cancer, maybe cervical/?uterine- pt to find out Some constipation- miralax helps  PCP: [facility] does not desire labs Patient's last menstrual period was 12/02/2023. The current method of family planning is  Xulane patches .  Last pap 09/07/20. Results were: NILM w/ HRHPV not done. H/O abnormal pap: no Last mammogram: never, did have u/s 02/01/21 for mass, bx confirmed fibroadenoma. Results were: N/A. Family h/o breast cancer: no Last colonoscopy: never. Results were: N/A. Family h/o colorectal cancer: no     01/02/2024    8:32 AM 10/19/2022    2:27 PM 10/19/2022    9:28 AM 10/11/2022    9:05 AM 09/11/2022   11:36 AM  Depression screen PHQ 2/9  Decreased Interest 0 0 0 0 0  Down, Depressed, Hopeless 0 0 0 0 0  PHQ - 2 Score 0 0 0 0 0  Altered sleeping 0   0 0  Tired, decreased energy 0   0 0  Change in appetite 0   0 0  Feeling bad or failure about yourself  0   0 0  Trouble concentrating 0   0 0  Moving slowly or fidgety/restless 0   0 0  Suicidal thoughts 0   0 0  PHQ-9 Score 0   0 0  Difficult doing work/chores    Not difficult at all Not difficult at all        01/02/2024    8:32 AM  10/19/2022    2:27 PM 06/12/2022   11:25 AM 01/27/2021    9:03 AM  GAD 7 : Generalized Anxiety Score  Nervous, Anxious, on Edge 0 1 0 0  Control/stop worrying 0 1 0 0  Worry too much - different things 0 1 0 0  Trouble relaxing 0 1 0 0  Restless 0 0 0 0  Easily annoyed or irritable 0 1 0 0  Afraid - awful might happen 0 0 0 0  Total GAD 7 Score 0 5 0 0  Anxiety Difficulty  Somewhat difficult       Review of Systems:   Pertinent items are noted in HPI Denies any headaches, blurred vision, fatigue, shortness of breath, chest pain, abdominal pain, abnormal vaginal discharge/itching/odor/irritation, problems with periods, bowel movements, urination, or intercourse unless otherwise stated above. Pertinent History Reviewed:  Reviewed past medical,surgical, social and family history.  Reviewed problem list, medications and allergies. Physical Assessment:   Vitals:   01/02/24 0826  BP: 119/85  Pulse: 79  Weight: 130 lb (59 kg)  Height: 5\' 6"  (1.676 m)  Body mass index is 20.98 kg/m.        Physical Examination:   General appearance - well appearing, and  in no distress  Mental status - alert, oriented to person, place, and time  Psych:  She has a normal mood and affect  Skin - warm and dry, normal color, no suspicious lesions noted  Chest - effort normal, all lung fields clear to auscultation bilaterally  Heart - normal rate and regular rhythm  Neck:  midline trachea, no thyromegaly or nodules  Breasts - breasts appear normal, inverted nipples, no flaking skin, no suspicious masses, no skin or nipple changes or  axillary nodes  Abdomen - soft, nontender, nondistended, no masses or organomegaly  Pelvic - VULVA: normal appearing vulva with no masses, tenderness or lesions  VAGINA: normal appearing vagina with normal color and discharge, no lesions  CERVIX: normal appearing cervix without discharge or lesions, no CMT  Thin prep pap is done w/ reflex HR HPV cotesting  UTERUS: uterus is  felt to be normal size, shape, consistency and nontender   ADNEXA: No adnexal masses or tenderness noted.  Extremities:  No swelling or varicosities noted  Chaperone: Peggy Dones    No results found for this or any previous visit (from the past 24 hours).  Assessment & Plan:  1) Well-Woman Exam  2) Bilateral itchy nipples> no obvious abnormalities. Rx APNO TID, apply w/ q-tip to inverted nipples. If not helping let me know  3) Contraception management> rx LoLo, stop Xulane patch, condoms x 2wks  Labs/procedures today: pap  Mammogram: @ 25yo, or sooner if problems Colonoscopy: @ 25yo, or sooner if problems  No orders of the defined types were placed in this encounter.   Meds:  Meds ordered this encounter  Medications   LO LOESTRIN FE 1 MG-10 MCG / 10 MCG tablet    Sig: Take 1 tablet by mouth daily.    Dispense:  90 tablet    Refill:  3    For co-pay card, pt to text "Lo Loestrin Fe " to [phone]              Co-pay card must be run in second position  "other coverage code 3"  if denied d/t PA, step edit, or insurance denial    Follow-up: Return in about 1 year (around 01/01/2025) for Physical.  Cheral Marker CNM, WHNP-BC 01/02/2024 9:09 AM

## 2024-01-07 ENCOUNTER — Encounter: Payer: Self-pay | Admitting: Women's Health

## 2024-01-07 ENCOUNTER — Telehealth: Payer: Self-pay | Admitting: Adult Health

## 2024-01-07 LAB — CYTOLOGY - PAP
Comment: NEGATIVE
Comment: NEGATIVE
Comment: NEGATIVE
HPV 16: POSITIVE — AB
HPV 18 / 45: NEGATIVE
High risk HPV: POSITIVE — AB

## 2024-01-07 NOTE — Telephone Encounter (Signed)
Called pt, Her mom had cervical cancer. She was worried, I told her about pap and that Selena Batten would get back with her in am but would probably repeat pap in in 1 and 2 years.

## 2024-01-08 ENCOUNTER — Encounter: Payer: Self-pay | Admitting: Women's Health

## 2024-01-08 DIAGNOSIS — R87619 Unspecified abnormal cytological findings in specimens from cervix uteri: Secondary | ICD-10-CM | POA: Insufficient documentation

## 2024-03-10 ENCOUNTER — Encounter: Payer: Self-pay | Admitting: Obstetrics & Gynecology

## 2024-03-10 ENCOUNTER — Encounter: Payer: Self-pay | Admitting: Women's Health

## 2024-03-10 ENCOUNTER — Ambulatory Visit: Admitting: Obstetrics & Gynecology

## 2024-03-10 VITALS — BP 119/79 | HR 67 | Ht 66.0 in | Wt 128.0 lb

## 2024-03-10 DIAGNOSIS — L02214 Cutaneous abscess of groin: Secondary | ICD-10-CM

## 2024-03-10 MED ORDER — SULFAMETHOXAZOLE-TRIMETHOPRIM 800-160 MG PO TABS: 1.0000 | ORAL_TABLET | Freq: Two times a day (BID) | ORAL | 0 refills | Status: AC

## 2024-03-10 MED ORDER — BACITRACIN 500 UNIT/GM EX OINT: 1.0000 | TOPICAL_OINTMENT | Freq: Two times a day (BID) | CUTANEOUS | 0 refills | Status: AC

## 2024-03-10 NOTE — Progress Notes (Signed)
   GYN VISIT Patient name: Elizabeth Larsen MRN [id number]  Date of birth: August 02, 2001 Chief Complaint:   Follow-up (Knot on groin, red inflamed.)  History of Present Illness:   Elizabeth Larsen is a 23 y.o. G48P1001  female being seen today for the following concerns:    Pea-sized knot that comes/goes for the past 4 mos.  Recently gotten bigger, painful and red and also notes a ring on her thigh.  No fever/chills.  The area of concern is right along her underwear line and seems to rub and cause discomfort.    No LMP recorded. (Menstrual status: Irregular Periods).    Review of Systems:   Pertinent items are noted in HPI Denies fever/chills, dizziness, headaches, visual disturbances, fatigue, shortness of breath, chest pain, abdominal pain, vomiting, no  problems with periods, bowel movements, urination, or intercourse unless otherwise stated above.  Pertinent History Reviewed:   Past Surgical History:  Procedure Laterality Date   TONSILLECTOMY AND ADENOIDECTOMY N/A 02/02/2020   Procedure: TONSILLECTOMY AND ADENOIDECTOMY;  Surgeon: Reynold Caves, MD;  Location: [facility];  Service: ENT;  Laterality: N/A;    Past Medical History:  Diagnosis Date   Anxiety    Chlamydia 08/03/2020   Treated 08/03/20 PCO________   History of enlarged tonsils    Hypoglycemia    IBS (irritable bowel syndrome)    Reviewed problem list, medications and allergies. Physical Assessment:   Vitals:   03/10/24 1601  BP: 119/79  Pulse: 67  Weight: 128 lb (58.1 kg)  Height: 5\' 6"  (1.676 m)  Body mass index is 20.66 kg/m.       Physical Examination:   General appearance: alert, well appearing, and in no distress  Psych: mood appropriate, normal affect  Skin: warm & dry   Cardiovascular: normal heart rate noted  Respiratory: normal respiratory effort, no distress  Abdomen: soft, non-tender   Pelvic: Right groin with ~ 1cm area of erythema, slightly raised.  No evidence of  induration/loculation  Extremities: no edema   Chaperone: N/A    Assessment & Plan:  1) Abscess/Cellulitis I&D not indicated Rx sent in for topical and oral antibiotic treatment Okay for warm compresses and sitz bath - Patient to follow-up if no improvement  Meds ordered this encounter  Medications   sulfamethoxazole -trimethoprim  (BACTRIM  DS) 800-160 MG tablet    Sig: Take 1 tablet by mouth 2 (two) times daily for 7 days.    Dispense:  14 tablet    Refill:  0   bacitracin  500 UNIT/GM ointment    Sig: Apply 1 Application topically 2 (two) times daily.    Dispense:  15 g    Refill:  0      No orders of the defined types were placed in this encounter.   Return if symptoms worsen or fail to improve.   Tamesha Ellerbrock, DO Attending Obstetrician & Gynecologist, [facility] for [employer], [facility]

## 2024-06-17 ENCOUNTER — Encounter: Payer: Self-pay | Admitting: Women's Health

## 2024-06-19 ENCOUNTER — Ambulatory Visit: Payer: Self-pay | Admitting: Obstetrics and Gynecology

## 2024-07-16 ENCOUNTER — Ambulatory Visit (INDEPENDENT_AMBULATORY_CARE_PROVIDER_SITE_OTHER): Payer: Self-pay | Admitting: Physician Assistant

## 2024-07-16 ENCOUNTER — Encounter: Payer: Self-pay | Admitting: Physician Assistant

## 2024-07-16 VITALS — BP 114/80 | HR 68 | Temp 97.3°F | Ht 66.0 in | Wt 127.0 lb

## 2024-07-16 DIAGNOSIS — Z7689 Persons encountering health services in other specified circumstances: Secondary | ICD-10-CM

## 2024-07-16 DIAGNOSIS — F411 Generalized anxiety disorder: Secondary | ICD-10-CM

## 2024-07-16 DIAGNOSIS — K5909 Other constipation: Secondary | ICD-10-CM | POA: Diagnosis not present

## 2024-07-16 MED ORDER — ESCITALOPRAM OXALATE 5 MG PO TABS: 5.0000 mg | ORAL_TABLET | Freq: Every day | ORAL | 1 refills | Status: DC

## 2024-07-16 NOTE — Assessment & Plan Note (Signed)
 Frequent anxiety with anxiety attacks affecting daily life. Discussed starting daily therapy to manage symptoms. Explained potential side effects and trial period for effectiveness. - Start Lexapro  5 mg daily, morning or night based on tolerance. - Patient counseled on side effects such as nausea and headache, she was advised to stop taking medication if she experiences suicidal ideation.  - Continue hydroxyzine  as needed for acute anxiety. - Follow-up in 6 weeks to evaluate Lexapro  effectiveness. - Order general lab work.

## 2024-07-16 NOTE — Assessment & Plan Note (Addendum)
 Chronic constipation with bloating. Overall benign physical exam, normal bowel sounds, mild generalized abdominal tenderness, not distension. She has seen GI in the past with incomplete work up due to inconsistent follow up. Previous celiac disease evaluation negative. Incomplete colonoscopy due to preparation issues. - Review previous GI records. Colonoscopy found to be not indicated as symptoms previously improved with MiraLAX.  - Advised increased water intake and high fiber diet. Advised MiraLAX or fiber chews daily for constipation. - Re-evaluate symptoms in 6 weeks. Discussed referral to GI if symptoms due not improve.  - Patient to follow up sooner for new or worsening fever, abdominal pain, blood in stool, or weight loss.

## 2024-07-16 NOTE — Progress Notes (Signed)
 New Patient Office Visit  Subjective    Patient ID: Elizabeth Larsen, female    DOB: 10/20/2001  Age: 23 y.o. MRN: [id number]  CC:  Chief Complaint  Patient presents with   New Patient (Initial Visit)   Anxiety    HPI [name] presents to establish care  Discussed the use of AI scribe software for clinical note transcription with the patient, who gave verbal consent to proceed.  History of Present Illness Elizabeth Larsen is a 22 year old female who presents with anxiety and constipation issues.  She experiences frequent anxiety attacks and uses hydroxyzine  as needed, which she finds effective. However, her anxiety has worsened since her grandfather's death a month ago. She takes hydroxyzine  infrequently, cutting the tablets in half. She relates anxiety is effecting her every day life.  Constipation has persisted for two years, with bowel movements occurring once or twice a week, not associated with pain. She has been seen by GI in the past and was unable to complete a colonoscopy preparation due to difficulty with the prep solution. She denies celiac disease but notes bloating with certain foods. Mucus in her stool has subsided.  She denies depression, suicidal thoughts, self-harm, nausea, vomiting, and weight loss. She is concerned about constipation and the need for a colonoscopy.   Outpatient Encounter Medications as of 07/16/2024  Medication Sig   bacitracin  500 UNIT/GM ointment Apply 1 Application topically 2 (two) times daily.   escitalopram  (LEXAPRO ) 5 MG tablet Take 1 tablet (5 mg total) by mouth daily.   hydrOXYzine  (ATARAX ) 25 MG tablet Take 25 mg by mouth 3 (three) times daily as needed.   LO LOESTRIN FE  1 MG-10 MCG / 10 MCG tablet Take 1 tablet by mouth daily.   No facility-administered encounter medications on file as of 07/16/2024.    Past Medical History:  Diagnosis Date   Anxiety    Chlamydia 08/03/2020   Treated 08/03/20 PCO________    History of enlarged tonsils    Hypoglycemia    IBS (irritable bowel syndrome)     Past Surgical History:  Procedure Laterality Date   TONSILLECTOMY AND ADENOIDECTOMY N/A 02/02/2020   Procedure: TONSILLECTOMY AND ADENOIDECTOMY;  Surgeon: Karis Clunes, MD;  Location: [facility];  Service: ENT;  Laterality: N/A;    Family History  Problem Relation Age of Onset   Cervical cancer Maternal Grandmother    Heart attack Father    Polycystic kidney disease Sister    Colon cancer Neg Hx    Esophageal cancer Neg Hx    Stomach cancer Neg Hx    Colon polyps Neg Hx    Rectal cancer Neg Hx     Social History   Socioeconomic History   Marital status: Single    Spouse name: Not on file   Number of children: 1   Years of education: Not on file   Highest education level: 12th grade  Occupational History   Occupation: receptionist  Tobacco Use   Smoking status: Former    Types: E-cigarettes   Smokeless tobacco: Former   Tobacco comments:    Geophysical data processor   Vaping status: Former   Substances: Nicotine, Flavoring  Substance and Sexual Activity   Alcohol use: Never    Comment: Occasional   Drug use: Never   Sexual activity: Yes  Other Topics Concern   Not on file  Social History Narrative   Not on file   Social Drivers of Health  Financial Resource Strain: Low Risk  (07/09/2024)   Overall Financial Resource Strain (CARDIA)    Difficulty of Paying Living Expenses: Not very hard  Food Insecurity: No Food Insecurity (07/09/2024)   Hunger Vital Sign    Worried About Running Out of Food in the Last Year: Never true    Ran Out of Food in the Last Year: Never true  Transportation Needs: No Transportation Needs (07/09/2024)   PRAPARE - Administrator, Civil Service (Medical): No    Lack of Transportation (Non-Medical): No  Physical Activity: Sufficiently Active (07/09/2024)   Exercise Vital Sign    Days of Exercise per Week: 5 days    Minutes of Exercise per  Session: 60 min  Stress: No Stress Concern Present (07/09/2024)   [employer] of Occupational Health - Occupational Stress Questionnaire    Feeling of Stress: Only a little  Social Connections: Moderately Isolated (07/09/2024)   Social Connection and Isolation Panel    Frequency of Communication with Friends and Family: More than three times a week    Frequency of Social Gatherings with Friends and Family: More than three times a week    Attends Religious Services: Never    Database administrator or Organizations: No    Attends Engineer, structural: Not on file    Marital Status: Living with partner  Intimate Partner Violence: Not At Risk (01/02/2024)   Humiliation, Afraid, Rape, and Kick questionnaire    Fear of Current or Ex-Partner: No    Emotionally Abused: No    Physically Abused: No    Sexually Abused: No    Review of Systems  Constitutional:  Negative for diaphoresis, fever, malaise/fatigue and weight loss.  Respiratory:  Negative for shortness of breath.   Cardiovascular:  Negative for chest pain.  Gastrointestinal:  Positive for constipation. Negative for abdominal pain, blood in stool, diarrhea, nausea and vomiting.  Psychiatric/Behavioral:  Negative for depression and suicidal ideas. The patient is nervous/anxious.      Objective    BP 114/80   Pulse 68   Temp (!) 97.3 F (36.3 C)   Ht 5' 6 (1.676 m)   Wt 127 lb (57.6 kg)   SpO2 98%   BMI 20.50 kg/m   Physical Exam Constitutional:      General: She is not in acute distress.    Appearance: Normal appearance. She is normal weight. She is not ill-appearing.  HENT:     Head: Normocephalic and atraumatic.     Mouth/Throat:     Mouth: Mucous membranes are moist.     Pharynx: Oropharynx is clear.  Eyes:     Extraocular Movements: Extraocular movements intact.     Conjunctiva/sclera: Conjunctivae normal.  Cardiovascular:     Rate and Rhythm: Normal rate and regular rhythm.     Heart sounds: Normal  heart sounds. No murmur heard. Pulmonary:     Effort: Pulmonary effort is normal.     Breath sounds: Normal breath sounds. No wheezing or rales.  Abdominal:     General: Abdomen is flat. Bowel sounds are normal. There is no distension.     Palpations: Abdomen is soft.     Tenderness: There is abdominal tenderness.  Skin:    General: Skin is warm and dry.  Neurological:     General: No focal deficit present.     Mental Status: She is alert and oriented to person, place, and time.  Psychiatric:        Mood  and Affect: Mood normal.        Behavior: Behavior normal.       Assessment & Plan:  Encounter to establish care  Generalized anxiety disorder Assessment & Plan: Frequent anxiety with anxiety attacks affecting daily life. Discussed starting daily therapy to manage symptoms. Explained potential side effects and trial period for effectiveness. - Start Lexapro  5 mg daily, morning or night based on tolerance. - Patient counseled on side effects such as nausea and headache, she was advised to stop taking medication if she experiences suicidal ideation.  - Continue hydroxyzine  as needed for acute anxiety. - Follow-up in 6 weeks to evaluate Lexapro  effectiveness. - Order general lab work.  Orders: -     TSH + free T4 -     Escitalopram  Oxalate; Take 1 tablet (5 mg total) by mouth daily.  Dispense: 30 tablet; Refill: 1  Chronic constipation Assessment & Plan: Chronic constipation with bloating. Overall benign physical exam, normal bowel sounds, mild generalized abdominal tenderness, not distension. She has seen GI in the past with incomplete work up due to inconsistent follow up. Previous celiac disease evaluation negative. Incomplete colonoscopy due to preparation issues. - Review previous GI records. Colonoscopy found to be not indicated as symptoms previously improved with MiraLAX.  - Advised increased water intake and high fiber diet. Advised MiraLAX or fiber chews daily for  constipation. - Re-evaluate symptoms in 6 weeks. Discussed referral to GI if symptoms due not improve.  - Patient to follow up sooner for new or worsening fever, abdominal pain, blood in stool, or weight loss.   Orders: -     CBC -     Comprehensive metabolic panel with GFR -     TSH + free T4    Return in about 6 weeks (around 08/27/2024) for anxiety and constipation .   Charmaine Ladona Rosten, PA-C

## 2024-07-18 ENCOUNTER — Encounter: Payer: Self-pay | Admitting: Physician Assistant

## 2024-07-18 LAB — CBC
Hematocrit: 42.7 % (ref 34.0–46.6)
Hemoglobin: 14.1 g/dL (ref 11.1–15.9)
MCH: 31.5 pg (ref 26.6–33.0)
MCHC: 33 g/dL (ref 31.5–35.7)
MCV: 95 fL (ref 79–97)
Platelets: 297 x10E3/uL (ref 150–450)
RBC: 4.48 x10E6/uL (ref 3.77–5.28)
RDW: 12.2 % (ref 11.7–15.4)
WBC: 6.5 x10E3/uL (ref 3.4–10.8)

## 2024-07-18 LAB — COMPREHENSIVE METABOLIC PANEL WITH GFR
ALT: 9 IU/L (ref 0–32)
AST: 13 IU/L (ref 0–40)
Albumin: 4.5 g/dL (ref 4.0–5.0)
Alkaline Phosphatase: 61 IU/L (ref 44–121)
BUN/Creatinine Ratio: 11 (ref 9–23)
BUN: 7 mg/dL (ref 6–20)
Bilirubin Total: 0.4 mg/dL (ref 0.0–1.2)
CO2: 22 mmol/L (ref 20–29)
Calcium: 9.7 mg/dL (ref 8.7–10.2)
Chloride: 102 mmol/L (ref 96–106)
Creatinine, Ser: 0.66 mg/dL (ref 0.57–1.00)
Globulin, Total: 2.3 g/dL (ref 1.5–4.5)
Glucose: 99 mg/dL (ref 70–99)
Potassium: 4.3 mmol/L (ref 3.5–5.2)
Sodium: 139 mmol/L (ref 134–144)
Total Protein: 6.8 g/dL (ref 6.0–8.5)
eGFR: 126 mL/min/1.73 (ref >59)

## 2024-07-18 LAB — TSH+FREE T4
Free T4: 1.1 ng/dL (ref 0.82–1.77)
TSH: 1.47 u[IU]/mL (ref 0.450–4.500)

## 2024-07-22 ENCOUNTER — Ambulatory Visit: Payer: Self-pay | Admitting: Physician Assistant

## 2024-08-09 ENCOUNTER — Other Ambulatory Visit: Payer: Self-pay | Admitting: Physician Assistant

## 2024-08-09 DIAGNOSIS — F411 Generalized anxiety disorder: Secondary | ICD-10-CM

## 2024-08-13 ENCOUNTER — Ambulatory Visit: Admitting: Physician Assistant

## 2024-08-27 ENCOUNTER — Ambulatory Visit: Admitting: Physician Assistant

## 2024-08-27 ENCOUNTER — Encounter: Payer: Self-pay | Admitting: Physician Assistant

## 2024-08-27 VITALS — BP 118/80 | HR 71 | Resp 18 | Ht 66.0 in | Wt 127.1 lb

## 2024-08-27 DIAGNOSIS — F411 Generalized anxiety disorder: Secondary | ICD-10-CM | POA: Diagnosis not present

## 2024-08-27 DIAGNOSIS — K5909 Other constipation: Secondary | ICD-10-CM

## 2024-08-27 NOTE — Assessment & Plan Note (Signed)
 Much improved since start Lexapro . Denies anxiety attacks. No adverse reactions. Continue current treatment plan, follow up in 6 months.

## 2024-08-27 NOTE — Assessment & Plan Note (Signed)
 Unchanged, however has not started Miralax or increased fiber. No abdominal tenderness or bloating. Advised high fiber diet and Miralax. Follow up in 6 months or if symptoms worsen.

## 2024-08-27 NOTE — Progress Notes (Signed)
 Established Patient Office Visit  Subjective   Patient ID: Elizabeth Larsen, female    DOB: [birth year]  Age: 23 y.o. MRN: [id number]  Chief Complaint  Patient presents with   Medical Management of Chronic Issues    6 week follow up of anxiety     Discussed the use of AI scribe software for clinical note transcription with the patient, who gave verbal consent to proceed.  History of Present Illness Elizabeth Larsen is a 23 year old female who presents for follow-up of anxiety and constipation.  Her anxiety has improved significantly with Lexapro  5 mg daily, and she has not experienced any anxiety attacks. Hydroxyzine  is available at home but has not been needed recently. Denies adverse effects from medication.   Constipation remains unchanged with bowel movements approximately twice a week. There is no significant pain or blood in the stool. She has not started Miralax or increased her intake of fruits, vegetables, or fiber. Initially, Honey Nut Cheerios seemed to help, but constipation returned.  No other concerns, complaints, or questions today.    Review of Systems  Constitutional:  Negative for chills, fever and weight loss.  Gastrointestinal:  Positive for constipation. Negative for abdominal pain, blood in stool, nausea and vomiting.  Psychiatric/Behavioral:  Negative for depression and suicidal ideas. The patient is not nervous/anxious and does not have insomnia.       Objective:     BP 118/80   Pulse 71   Resp 18   Ht 5' 6 (1.676 m)   Wt 127 lb 1.3 oz (57.6 kg)   LMP  (LMP Unknown)   SpO2 98%   BMI 20.51 kg/m    Physical Exam Constitutional:      General: She is not in acute distress.    Appearance: Normal appearance. She is normal weight. She is not ill-appearing.  HENT:     Head: Normocephalic.     Mouth/Throat:     Mouth: Mucous membranes are moist.     Pharynx: Oropharynx is clear.  Eyes:     Extraocular Movements: Extraocular movements  intact.     Conjunctiva/sclera: Conjunctivae normal.  Cardiovascular:     Rate and Rhythm: Normal rate and regular rhythm.     Heart sounds: Normal heart sounds. No murmur heard. Pulmonary:     Effort: Pulmonary effort is normal.     Breath sounds: Normal breath sounds.  Skin:    General: Skin is warm and dry.  Neurological:     General: No focal deficit present.     Mental Status: She is alert and oriented to person, place, and time.  Psychiatric:        Mood and Affect: Mood normal.        Behavior: Behavior normal.     No results found for any visits on 08/27/24.  The ASCVD Risk score (Arnett DK, et al., 2019) failed to calculate for the following reasons:   The 2019 ASCVD risk score is only valid for ages 8 to 46    Assessment & Plan:   Return in about 6 months (around 02/25/2025) for anxiety.   Generalized anxiety disorder Assessment & Plan: Much improved since start Lexapro . Denies anxiety attacks. No adverse reactions. Continue current treatment plan, follow up in 6 months.    Chronic constipation Assessment & Plan: Unchanged, however has not started Miralax or increased fiber. No abdominal tenderness or bloating. Advised high fiber diet and Miralax. Follow up in 6 months or if symptoms  worsen.      Charmaine Deaunte Dente, PA-C

## 2024-09-30 ENCOUNTER — Encounter: Payer: Self-pay | Admitting: Physician Assistant

## 2024-10-01 ENCOUNTER — Telehealth: Payer: Self-pay | Admitting: Physician Assistant

## 2024-10-01 NOTE — Telephone Encounter (Signed)
 Patient dropped off staff health assessment form to be completed in your folder.

## 2024-11-02 ENCOUNTER — Encounter: Payer: Self-pay | Admitting: Physician Assistant

## 2024-11-27 ENCOUNTER — Other Ambulatory Visit: Payer: Self-pay | Admitting: Women's Health

## 2024-12-09 ENCOUNTER — Encounter: Payer: Self-pay | Admitting: Physician Assistant

## 2024-12-16 ENCOUNTER — Ambulatory Visit: Admitting: Physician Assistant

## 2024-12-19 ENCOUNTER — Ambulatory Visit: Admitting: Physician Assistant

## 2025-01-08 ENCOUNTER — Ambulatory Visit: Payer: Self-pay | Admitting: Women's Health
